# Patient Record
Sex: Female | Born: 1975 | Race: Black or African American | Hispanic: No | Marital: Single | Smoking: Current some day smoker
Health system: Southern US, Community
[De-identification: ages and names within clinical notes are randomized; demographics above are authoritative.]

## PROBLEM LIST (undated history)

## (undated) DIAGNOSIS — E119 Type 2 diabetes mellitus without complications: Secondary | ICD-10-CM

## (undated) DIAGNOSIS — M109 Gout, unspecified: Secondary | ICD-10-CM

## (undated) DIAGNOSIS — D696 Thrombocytopenia, unspecified: Secondary | ICD-10-CM

## (undated) DIAGNOSIS — E538 Deficiency of other specified B group vitamins: Secondary | ICD-10-CM

## (undated) DIAGNOSIS — M1711 Unilateral primary osteoarthritis, right knee: Secondary | ICD-10-CM

## (undated) DIAGNOSIS — I10 Essential (primary) hypertension: Secondary | ICD-10-CM

## (undated) DIAGNOSIS — M1712 Unilateral primary osteoarthritis, left knee: Secondary | ICD-10-CM

## (undated) HISTORY — PX: TONSILLECTOMY: SUR1361

## (undated) NOTE — *Deleted (*Deleted)
[facility]  [address] [place], [place] [address] Phone: [phone]  Fax: [phone]  Clinic Day:  10/30/2020  Referring physician: Armando Gang, FNP  Chief Complaint: Taylor Rodgers is a 51 y.o. female with thrombocytopenia who is seen for reassessment.  HPI: The patient was last seen in the hematology clinic on 09/29/2019 via telemedicine. At that time, she was doing well.  She denied any excess bruising or bleeding. CBC on 09/22/2019 revealed a hematocrit of 37.0, hemoglobin 12.3, MCV 92.5, platelets 76,000, WBC 9600 with an ANC of 5600.  She was to follow up in 1 month for labs and review of abdominal ultrasound to assess spleen size.  She was lost to follow-up.  During the interim, ***   Past Medical History:  Diagnosis Date  . Diabetes mellitus without complication (HCC)   . Gout   . Hypertension     Past Surgical History:  Procedure Laterality Date  . TONSILLECTOMY      Family History  Problem Relation Age of Onset  . Hypertension Mother   . Cancer Mother   . Cancer Father     Social History:  reports that she has been smoking cigarettes. She has never used smokeless tobacco. She reports current alcohol use. She reports that she does not use drugs. She has a deceased son. 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 She works in [employer]. She is exposed to hepatitis, HIV, and AIDS because she work with a lot of homeless individuals. She makes sure to get checked regularly. She lives in [place], [place]. The patient is alone*** today.  Allergies:  Allergies  Allergen Reactions  . Gluten Meal   . Other     Glue    Current Medications: Current Outpatient Medications  Medication Sig Dispense Refill  . allopurinol (ZYLOPRIM) 300 MG tablet Take 300 mg by mouth daily.  2  . atenolol (TENORMIN) 100 MG tablet Take 100 mg by mouth 2 (two) times daily.  2  . chlorthalidone (HYGROTON) 50 MG  tablet Take 50 mg by mouth daily.     . Cholecalciferol (VITAMIN D3) 25 MCG (1000 UT) CAPS Take 1 capsule by mouth daily.     . cyanocobalamin (,VITAMIN B-12,) 1000 MCG/ML injection INJECT 1 ML ONCE EVERY MONTH    . docusate sodium (COLACE) 100 MG capsule Take 100 mg by mouth daily.    Marland Kitchen EDARBYCLOR 40-25 MG TABS Take 1 tablet by mouth daily.    . FEROSUL 325 (65 Fe) MG tablet Take 325 mg by mouth daily.    Marland Kitchen levocetirizine (XYZAL) 5 MG tablet Take 5 mg by mouth daily.  5  . metFORMIN (GLUCOPHAGE) 500 MG tablet Take 500 mg by mouth 2 (two) times daily.  2  . potassium chloride SA (KLOR-CON) 20 MEQ tablet Take 20 mEq by mouth daily.     . pravastatin (PRAVACHOL) 20 MG tablet Take 20 mg by mouth every evening.  0   No current facility-administered medications for this visit.    Review of Systems  Constitutional: Negative for chills, diaphoresis, fever, malaise/fatigue and weight loss.       Feels "good".  HENT: Negative.  Negative for congestion, ear pain, hearing loss, nosebleeds, sinus pain and sore throat.   Eyes: Negative.  Negative for blurred vision and double vision.  Respiratory: Negative.  Negative for cough, hemoptysis, sputum production and shortness of breath.   Cardiovascular: Negative.  Negative for chest  pain, palpitations and leg swelling.  Gastrointestinal: Negative for abdominal pain, blood in stool, constipation, diarrhea, heartburn, melena, nausea and vomiting.  Genitourinary: Negative.  Negative for dysuria, frequency, hematuria and urgency.       Menses around the 14th - 18th of the month.  Musculoskeletal: Positive for joint pain (bad knees). Negative for back pain, myalgias and neck pain.  Skin: Negative for itching and rash.       Eczema.  Neurological: Negative.  Negative for dizziness, tingling, sensory change, speech change, focal weakness, weakness and headaches.  Endo/Heme/Allergies: Does not bruise/bleed easily.       Type II diabetes, well managed.   Psychiatric/Behavioral: Negative.  Negative for depression and memory loss. The patient is not nervous/anxious and does not have insomnia.   All other systems reviewed and are negative.  Performance status (ECOG): 0***  Physical Exam Nursing note reviewed.  Constitutional:      General: She is not in acute distress.    Appearance: She is well-developed.  HENT:     Head: Normocephalic and atraumatic.  Eyes:     General: No scleral icterus.    Conjunctiva/sclera: Conjunctivae normal.     Comments: Brown eyes.  Neurological:     Mental Status: She is alert and oriented to person, place, and time.  Psychiatric:        Behavior: Behavior normal.        Thought Content: Thought content normal.        Judgment: Judgment normal.     No visits with results within 3 Day(s) from this visit.  Latest known visit with results is:  Appointment on 09/22/2019  Component Date Value Ref Range Status  . Path Review 09/22/2019 Blood smear is reviewed.   Final   Comment: Thrombocytopenia, with unremarkable platelet morphology. Normal RBC indices. No increase in schistocytes. Normal WBC count and differential. The cause of the patient's thrombocytopenia is unclear from morphologic evaluation. Clinical correlation is recommended. Reviewed by Beryle Quant, M.D. Performed at [facility], [address] [place], [place] [address]   . aPTT 09/22/2019 32  24 - 36 seconds Final   Performed at [facility], [address] [place], [place] [address]  . Prothrombin Time 09/22/2019 13.0  11.4 - 15.2 seconds Final  . INR 09/22/2019 1.0  0.8 - 1.2 Final   Comment: (NOTE) INR goal varies based on device and disease states. Performed at [facility], [address], [place], [place] [address]   . Anti Nuclear Antibody (ANA) 09/22/2019 Negative  Negative Final   Comment: (NOTE) Performed At: [facility] [address] [place], [place] [id number] Jolene Schimke  MD IO:[id number]   . HIV Screen 4th Generation wRfx 09/22/2019 NON REACTIVE  NON REACTIVE Final   Performed at [facility], [address], [place], [place] [address]  . HCV Ab 09/22/2019 NON REACTIVE  NON REACTIVE Final   Comment: (NOTE) Nonreactive HCV antibody screen is consistent with no HCV infections,  unless recent infection is suspected or other evidence exists to indicate HCV infection. Performed at [facility], [address] [place], [place] [address]   . Hepatitis B Surface Ag 09/22/2019 NON REACTIVE  NON REACTIVE Final   Performed at [facility], [address] [place], [place] [address]  . Hep B Core Total Ab 09/22/2019 NON REACTIVE  NON REACTIVE Final   Performed at [facility], [address], [place], [place] [address]  .  Platelet CT in Citrate 09/22/2019 62   Final   Performed at [facility], [address], [place], [place] [address]  . WBC 09/22/2019 9.6  4.0 - 10.5 K/uL Final  . RBC 09/22/2019 4.00  3.87 - 5.11 MIL/uL Final  . Hemoglobin 09/22/2019 12.3  12.0 - 15.0 g/dL Final  . HCT 46/96/2952 37.0  36 - 46 % Final  . MCV 09/22/2019 92.5  80.0 - 100.0 fL Final  . MCH 09/22/2019 30.8  26.0 - 34.0 pg Final  . MCHC 09/22/2019 33.2  30.0 - 36.0 g/dL Final  . RDW 84/13/2440 13.1  11.5 - 15.5 % Final  . Platelets 09/22/2019 76* 150 - 400 K/uL Final   Comment: Immature Platelet Fraction may be clinically indicated, consider ordering this additional test [id number]   . nRBC 09/22/2019 0.0  0.0 - 0.2 % Final  . Neutrophils Relative % 09/22/2019 57  % Final  . Neutro Abs 09/22/2019 5.6  1.7 - 7.7 K/uL Final  . Lymphocytes Relative 09/22/2019 31  % Final  . Lymphs Abs 09/22/2019 3.0  0.7 - 4.0 K/uL Final  . Monocytes Relative 09/22/2019 9  % Final  . Monocytes Absolute 09/22/2019 0.8  0.1 - 1.0 K/uL Final  . Eosinophils Relative 09/22/2019 2  % Final  . Eosinophils Absolute 09/22/2019 0.1  0.0 - 0.5 K/uL Final  . Basophils  Relative 09/22/2019 1  % Final  . Basophils Absolute 09/22/2019 0.1  0.0 - 0.1 K/uL Final  . Immature Granulocytes 09/22/2019 0  % Final  . Abs Immature Granulocytes 09/22/2019 0.03  0.00 - 0.07 K/uL Final   Performed at [facility], [address] [place], [place] [address]    Assessment:  Taylor Rodgers is a 42 y.o. female with thrombocytopenia.  Platelet count was 76,000 on 08/25/2019. MPV was 13.8 (6-12).  Patient denies any new medications or herbal products.  Labs on 08/25/2019 included hematocrit 37.0, hemoglobin 12.0, MCV 96.0, platelets 76,000, and WBC 8,600 with an ANC of 4900.  Differential included 57% segs, 32% lymphs, 8% monocytes and 2% eosinophils.  Normal studies included:  Creatinine (0.81), LFTs, vitamin B12 (1019), folate (> 24.00).  Ferritin was 26.1 with an iron saturation of 28% and a TIBC of 423.  Uric acid was 7.1 (2.3-6.6). TSH was 2.829 with a T3 of 81 (87-178) and a T4 9.66 (6.1-12.2).   Work-up on 09/22/2019 revealed a hematocrit 37, hemoglobin 12.3, MCV 92.5, platelets 76,000, WBC 9600, ANC 5600. Platelet count in citrate tube was 62,000. Normal studies included:  ANA , PT, PTT, hepatitis B surface antigen, hepatitis B core antibody, hepatitis C antibody, and HIV testing.  Peripheral smear revealed thrombocytopenia with unremarkable platelet morphology.   She has vitamin B deficiency.  She is on oral B12.  She is on oral iron.  The patient received the Pfizer COVID-19 vaccine on 02/17/2020 and 03/13/2020.  Symptomatically, ***  Plan: 1.   Labs today: CBC with diff, IPF, CMP, ferritin, iron studies, B12, folate.   2.   Thrombocytopenia             Work-up is negative to date.  No new medications or herbal products implicated.  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   Discuss diagnosis of excluuion.   Discuss obtaining an abdominal ultrasound to assess spleen size (should be normal).             Patient  notes low platelets have been associated  with menses x    Check CBC on 10/12/2019. 3.   Hypertension             Blood pressure better controlled. 4.   RTC on 10/12/2019 for labs (CBC with diff). 5.   Abdominal ultrasound- assess spleen size. 6.   RTC in 1 month for MD assessment, labs (CBC with diff), and review of abdominal ultrasound.  I discussed the assessment and treatment plan with the patient.  The patient was provided an opportunity to ask questions and all were answered.  The patient agreed with the plan and demonstrated an understanding of the instructions.  The patient was advised to call back if the symptoms worsen or if the condition fails to improve as anticipated.  I provided *** minutes of face-to-face time during this this encounter and > 50% was spent counseling as documented under my assessment and plan.  Rosey Bath, MD, PhD    10/30/2020, 1:58 PM  I, Jerilee Field, am acting as a Neurosurgeon for Rosey Bath, MD.  I, Dishon Kehoe C. Merlene Pulling, MD, have reviewed the above documentation for accuracy and completeness, and I agree with the above.

---

## 2010-04-20 ENCOUNTER — Emergency Department: Payer: Self-pay | Admitting: Emergency Medicine

## 2010-09-07 ENCOUNTER — Emergency Department: Payer: Self-pay | Admitting: Emergency Medicine

## 2011-08-27 ENCOUNTER — Ambulatory Visit: Payer: Self-pay | Admitting: Family Medicine

## 2013-02-14 ENCOUNTER — Ambulatory Visit: Payer: Self-pay

## 2013-12-23 ENCOUNTER — Emergency Department: Payer: Self-pay | Admitting: Emergency Medicine

## 2018-08-19 ENCOUNTER — Ambulatory Visit
Admission: EM | Admit: 2018-08-19 | Discharge: 2018-08-19 | Disposition: A | Discharge status: Home / Self Care | Payer: 59 | Attending: Family Medicine | Admitting: Family Medicine

## 2018-08-19 ENCOUNTER — Encounter: Payer: Self-pay | Admitting: Emergency Medicine

## 2018-08-19 ENCOUNTER — Other Ambulatory Visit: Payer: Self-pay

## 2018-08-19 DIAGNOSIS — R350 Frequency of micturition: Secondary | ICD-10-CM

## 2018-08-19 DIAGNOSIS — R300 Dysuria: Secondary | ICD-10-CM

## 2018-08-19 HISTORY — DX: Type 2 diabetes mellitus without complications: E11.9

## 2018-08-19 HISTORY — DX: Essential (primary) hypertension: I10

## 2018-08-19 HISTORY — DX: Gout, unspecified: M10.9

## 2018-08-19 LAB — URINALYSIS, COMPLETE (UACMP) WITH MICROSCOPIC
BACTERIA UA: NONE SEEN
Bilirubin Urine: NEGATIVE
GLUCOSE, UA: NEGATIVE mg/dL
Hgb urine dipstick: NEGATIVE
Ketones, ur: NEGATIVE mg/dL
LEUKOCYTES UA: NEGATIVE
NITRITE: POSITIVE — AB
PH: 7 (ref 5.0–8.0)
Protein, ur: >300 mg/dL — AB
SPECIFIC GRAVITY, URINE: 1.025 (ref 1.005–1.030)

## 2018-08-19 MED ORDER — FLUCONAZOLE 150 MG PO TABS: 150.0000 mg | ORAL_TABLET | Freq: Every day | ORAL | 0 refills | Status: DC

## 2018-08-19 MED ORDER — CEPHALEXIN 500 MG PO CAPS: 500.0000 mg | ORAL_CAPSULE | Freq: Two times a day (BID) | ORAL | 0 refills | Status: DC

## 2018-08-19 NOTE — ED Triage Notes (Signed)
Patient c/o burning when urinating and increase in urinary frequency that started on Wed.

## 2018-08-19 NOTE — ED Provider Notes (Signed)
[facility]    CSN: [id number] Arrival date & time: 08/19/18  1746     History   Chief Complaint Chief Complaint  Patient presents with  . Urinary Frequency  . Dysuria    HPI Taylor Rodgers is a 42 y.o. female.   The history is provided by the patient.  Dysuria  Pain quality:  Burning Pain severity:  Moderate Onset quality:  Sudden Duration:  3 days Timing:  Constant Progression:  Unchanged Chronicity:  New Recent urinary tract infections: no   Relieved by:  Nothing Ineffective treatments:  Phenazopyridine and cranberry juice Urinary symptoms: frequent urination   Associated symptoms: vaginal discharge (reports had a recent yeast infection treated with over the counter antifungal cream and improved but not completely resolved)   Risk factors: no hx of pyelonephritis, no hx of urolithiasis, no kidney transplant, not pregnant, no recurrent urinary tract infections, no renal cysts, no renal disease, no single kidney and no urinary catheter     Past Medical History:  Diagnosis Date  . Diabetes mellitus without complication (HCC)   . Gout   . Hypertension     There are no active problems to display for this patient.   Past Surgical History:  Procedure Laterality Date  . TONSILLECTOMY      OB History   None      Home Medications    Prior to Admission medications   Medication Sig Start Date End Date Taking? Authorizing Provider  atenolol (TENORMIN) 100 MG tablet Take 100 mg by mouth 2 (two) times daily. 08/11/18  Yes [provider]  carvedilol (COREG) 25 MG tablet Take 25 mg by mouth 2 (two) times daily. 08/11/18  Yes [provider]  enalapril (VASOTEC) 20 MG tablet Take 20 mg by mouth 2 (two) times daily. 08/11/18  Yes [provider]  hydrochlorothiazide (HYDRODIURIL) 50 MG tablet Take 50 mg by mouth daily. 07/23/18  Yes [provider]  levocetirizine (XYZAL) 5 MG tablet Take 5 mg by mouth daily. 08/08/18   Yes [provider]  metFORMIN (GLUCOPHAGE) 500 MG tablet Take 500 mg by mouth 2 (two) times daily. 08/06/18  Yes [provider]  potassium chloride (K-DUR) 10 MEQ tablet Take 10 mEq by mouth daily. 08/12/18  Yes [provider]  pravastatin (PRAVACHOL) 20 MG tablet Take 20 mg by mouth every evening. 08/11/18  Yes [provider]  allopurinol (ZYLOPRIM) 300 MG tablet Take 300 mg by mouth daily. 08/11/18   [provider]  cephALEXin (KEFLEX) 500 MG capsule Take 1 capsule (500 mg total) by mouth 2 (two) times daily. 08/19/18   Payton Mccallum, MD  fluconazole (DIFLUCAN) 150 MG tablet Take 1 tablet (150 mg total) by mouth daily. 08/19/18   Payton Mccallum, MD    Family History Family History  Problem Relation Age of Onset  . Hypertension Mother   . Cancer Mother   . Cancer Father     Social History Social History   Tobacco Use  . Smoking status: Current Some Day Smoker    Types: Cigarettes  . Smokeless tobacco: Never Used  Substance Use Topics  . Alcohol use: Yes  . Drug use: Never     Allergies   Patient has no known allergies.   Review of Systems Review of Systems  Genitourinary: Positive for dysuria and vaginal discharge (reports had a recent yeast infection treated with over the counter antifungal cream and improved but not completely resolved).     Physical  Exam Triage Vital Signs ED Triage Vitals  Enc Vitals Group     BP 08/19/18 1821 (!) 170/94     Pulse Rate 08/19/18 1821 71     Resp 08/19/18 1821 16     Temp 08/19/18 1821 98.4 F (36.9 C)     Temp Source 08/19/18 1821 Oral     SpO2 08/19/18 1821 100 %     Weight 08/19/18 1819 (!) 330 lb (149.7 kg)     Height 08/19/18 1819 5\' 8"  (1.727 m)     Head Circumference --      Peak Flow --      Pain Score 08/19/18 1819 5     Pain Loc --      Pain Edu? --      Excl. in GC? --    No data found.  Updated Vital Signs BP (!) 170/94 (BP Location: Right Arm)   Pulse 71    Temp 98.4 F (36.9 C) (Oral)   Resp 16   Ht 5\' 8"  (1.727 m)   Wt (!) 149.7 kg   LMP 08/03/2018 (Approximate)   SpO2 100%   BMI 50.18 kg/m   Visual Acuity Right Eye Distance:   Left Eye Distance:   Bilateral Distance:    Right Eye Near:   Left Eye Near:    Bilateral Near:     Physical Exam  Constitutional: She appears well-developed and well-nourished. No distress.  Abdominal: Soft. Bowel sounds are normal. She exhibits no distension and no mass. There is no tenderness. There is no rebound and no guarding.  Skin: She is not diaphoretic.  Nursing note and vitals reviewed.    UC Treatments / Results  Labs (all labs ordered are listed, but only abnormal results are displayed) Labs Reviewed  URINALYSIS, COMPLETE (UACMP) WITH MICROSCOPIC - Abnormal; Notable for the following components:      Result Value   Protein, ur >300 (*)    Nitrite POSITIVE (*)    All other components within normal limits  URINE CULTURE    EKG None  Radiology No results found.  Procedures Procedures (including critical care time)  Medications Ordered in UC Medications - No data to display  Initial Impression / Assessment and Plan / UC Course  I have reviewed the triage vital signs and the nursing notes.  Pertinent labs & imaging results that were available during my care of the patient were reviewed by me and considered in my medical decision making (see chart for details).      Final Clinical Impressions(s) / UC Diagnoses   Final diagnoses:  Dysuria  Urinary frequency   Discharge Instructions   None    ED Prescriptions    Medication Sig Dispense Auth. Provider   cephALEXin (KEFLEX) 500 MG capsule Take 1 capsule (500 mg total) by mouth 2 (two) times daily. 6 capsule Payton Mccallumonty, Keryn Nessler, MD   fluconazole (DIFLUCAN) 150 MG tablet Take 1 tablet (150 mg total) by mouth daily. 1 tablet Payton Mccallumonty, Analy Bassford, MD     1. diagnosis reviewed with patient 2. rx as per orders above; reviewed  possible side effects, interactions, risks and benefits  3. Recommend supportive treatment with increased fluids 4. Follow-up prn if symptoms worsen or don't improve   Controlled Substance Prescriptions [place] Controlled Substance Registry consulted? Not Applicable   Payton Mccallumonty, Rashanda Magloire, MD 08/19/18 1919

## 2018-08-21 LAB — URINE CULTURE: SPECIAL REQUESTS: NORMAL

## 2019-08-31 NOTE — Progress Notes (Signed)
[facility]  [address] [place], [place] [address] Phone: [phone]  Fax: [phone]   Clinic Day:  09/05/2019  Referring physician: Armando Gang, FNP  Chief Complaint: Taylor Rodgers is a 43 y.o. female with thrombocytopenia who is referred in consultation by Franco Nones, FNP  for assessment and management.   HPI:  The patient was seen by Franco Nones, FNP on 08/25/2019. She noted vertigo and feeling "crappy". She had a rash on her left arm.  Platelet count was 76,000. MPV was 13.8 (6-12).  She continued her current medication regimen. She was advised to continue monitoring her glucose levels at home. She was encouraged to follow her diabetic diet and exercise regularly.   Additional labs on 08/25/2019 included hematocrit 37.0, hemoglobin 12.0, MCV 96.0, WBC 8,600 with an ANC of 4900.  Differential included 57% segs, 32% lymphs, 8% monocytes and 2% eosinophils.  Creatinine was 0.81.  LFTs were normal.  Vitamin B12 was 1019. Folate was > 24.00. Ferritin was 26.1 with an iron saturation of 28% and a TIBC of 423.  Uric acid was 7.1 (2.3-6.6). TSH was 2.829 with a T3 of 81 (87-178) and a T4 9.66 (6.1-12.2).   She has a history of COPD, type II diabetes mellitus, and vitamin B deficiency since 03/01/2017. She is taking vitamin B12 1,000 mcg a day.   Symptomatically, the patient feels "nervous".  Her BP is 210/97 in the clinic today. Repeat BP was 234/120.  Decision was made to postpone her clinic visit and send her directly to the [facility] to manage her blood pressure.    Past Medical History:  Diagnosis Date  . Diabetes mellitus without complication (HCC)   . Gout   . Hypertension     Past Surgical History:  Procedure Laterality Date  . TONSILLECTOMY      Family History  Problem Relation Age of Onset  . Hypertension Mother   . Cancer Mother   . Cancer Father     Social History:  reports that she has been smoking  cigarettes. She has never used smokeless tobacco. She reports current alcohol use. She reports that she does not use drugs.The patient is alone today.  Allergies: No Known Allergies  Current Medications: Current Outpatient Medications  Medication Sig Dispense Refill  . allopurinol (ZYLOPRIM) 300 MG tablet Take 300 mg by mouth daily.  2  . atenolol (TENORMIN) 100 MG tablet Take 100 mg by mouth 2 (two) times daily.  2  . Cholecalciferol (VITAMIN D3) 25 MCG (1000 UT) CAPS Take by mouth.    . cyanocobalamin (,VITAMIN B-12,) 1000 MCG/ML injection INJECT 1 ML ONCE EVERY MONTH    . EDARBYCLOR 40-25 MG TABS Take 1 tablet by mouth daily.    . FEROSUL 325 (65 Fe) MG tablet Take 325 mg by mouth daily.    . fluconazole (DIFLUCAN) 150 MG tablet Take 1 tablet (150 mg total) by mouth daily. 1 tablet 0  . levocetirizine (XYZAL) 5 MG tablet Take 5 mg by mouth daily.  5  . metFORMIN (GLUCOPHAGE) 500 MG tablet Take 500 mg by mouth 2 (two) times daily.  2  . potassium chloride SA (KLOR-CON) 20 MEQ tablet     . pravastatin (PRAVACHOL) 20 MG tablet Take 20 mg by mouth every evening.  0  . carvedilol (COREG) 25 MG tablet Take 25 mg by mouth 2 (two) times daily.  2  . cephALEXin (KEFLEX) 500 MG capsule Take 1 capsule (500  mg total) by mouth 2 (two) times daily. (Patient not taking: Reported on 09/05/2019) 6 capsule 0  . enalapril (VASOTEC) 20 MG tablet Take 20 mg by mouth 2 (two) times daily.  2  . hydrochlorothiazide (HYDRODIURIL) 50 MG tablet Take 50 mg by mouth daily.  2  . potassium chloride (K-DUR) 10 MEQ tablet Take 10 mEq by mouth daily.  2   No current facility-administered medications for this visit.     Review of Systems  Constitutional: Negative for chills, diaphoresis, fever, malaise/fatigue and weight loss.       Feels "nervous".  HENT: Negative for congestion, ear discharge, ear pain, hearing loss, nosebleeds, sinus pain and sore throat.   Eyes: Negative for blurred vision and double vision.   Respiratory: Negative for cough, hemoptysis, sputum production and shortness of breath.        COPD.  Cardiovascular: Negative for chest pain, palpitations and leg swelling.  Gastrointestinal: Negative for abdominal pain, blood in stool, constipation, diarrhea, heartburn, melena, nausea and vomiting.  Genitourinary: Negative for dysuria, frequency, hematuria and urgency.  Musculoskeletal: Negative for back pain, joint pain, myalgias and neck pain.  Skin: Negative for itching and rash.  Neurological: Negative for dizziness, tingling, sensory change, weakness and headaches.  Endo/Heme/Allergies: Does not bruise/bleed easily.       Type II diabetes.  Psychiatric/Behavioral: Negative for depression and memory loss. The patient is nervous/anxious. The patient does not have insomnia.   All other systems reviewed and are negative.  Performance status (ECOG): not evaluated  Vitals Blood pressure (!) 210/97, pulse 69, resp. rate 18, height 5\' 8"  (1.727 m), weight (!) 365 lb 15.4 oz (166 kg), SpO2 100 %.   No visits with results within 3 Day(s) from this visit.  Latest known visit with results is:  Admission on 08/19/2018, Discharged on 08/19/2018  Component Date Value Ref Range Status  . Color, Urine 08/19/2018 YELLOW  YELLOW Final  . APPearance 08/19/2018 CLEAR  CLEAR Final  . Specific Gravity, Urine 08/19/2018 1.025  1.005 - 1.030 Final  . pH 08/19/2018 7.0  5.0 - 8.0 Final  . Glucose, UA 08/19/2018 NEGATIVE  NEGATIVE mg/dL Final  . Hgb urine dipstick 08/19/2018 NEGATIVE  NEGATIVE Final  . Bilirubin Urine 08/19/2018 NEGATIVE  NEGATIVE Final  . Ketones, ur 08/19/2018 NEGATIVE  NEGATIVE mg/dL Final  . Protein, ur 08/19/2018 >300* NEGATIVE mg/dL Final  . Nitrite 08/19/2018 POSITIVE* NEGATIVE Final  . Leukocytes, UA 08/19/2018 NEGATIVE  NEGATIVE Final  . Squamous Epithelial / LPF 08/19/2018 0-5  0 - 5 Final  . WBC, UA 08/19/2018 0-5  0 - 5 WBC/hpf Final  . RBC / HPF 08/19/2018 0-5  0 - 5  RBC/hpf Final  . Bacteria, UA 08/19/2018 NONE SEEN  NONE SEEN Final   Performed at [facility], [address], [place], [place] [address]  . Specimen Description 08/19/2018    Final                   Value:URINE, CLEAN CATCH Performed at [facility], [address] [place], [place] [address]   . Special Requests 08/19/2018    Final                   Value:Normal Performed at [facility], [address] [place], [place] [address]   . Culture 08/19/2018 MULTIPLE SPECIES PRESENT, SUGGEST RECOLLECTION*  Final  . Report Status 08/19/2018 08/21/2018 FINAL   Final  Assessment:  Taylor Rodgers is a 43 y.o. female with thrombocytopenia.  Platelet count was 76,000 on 08/25/2019. MPV was 13.8 (6-12).  Labs on 08/25/2019 included hematocrit 37.0, hemoglobin 12.0, MCV 96.0, platelets 76,000, and WBC 8,600 with an ANC of 4900.  Differential included 57% segs, 32% lymphs, 8% monocytes and 2% eosinophils.  Normal studies included:  Creatinine (0.81), LFTs, vitamin B12 (1019), folate (> 24.00).  Ferritin was 26.1 with an iron saturation of 28% and a TIBC of 423.  Uric acid was 7.1 (2.3-6.6). TSH was 2.829 with a T3 of 81 (87-178) and a T4 9.66 (6.1-12.2).   She has vitamin B deficiency.  She is on oral B12.  Blood pressure is BP is 210/97 (repeat 234/120).  Plan: 1.   Planned labs:  CBC with diff, platelet count in a blue top tube, PT, PTT, ANA with reflex, hepatitis B core antibody total, hepatitis B surface antigen, hepatitis C antibody, HIV antibody. 2.   Peripheral smear for pathology review. 3.   Thrombocytopenia  Consultation rescheduled for 09/22/2019 per patient request. 4.   Hypertension  Patient extremely nervous about being in the cancer center today.  Discuss that we also evaluate blood disorders such as anemia.  Reassured her that we are evaluating a mildly low platelet count.  She is [facility] for management of  her blood pressure. 5.  Reschedule consultation.   I discussed the assessment and treatment plan with the patient.  The patient was provided an opportunity to ask questions and all were answered.  The patient agreed with the plan and demonstrated an understanding of the instructions.  The patient was advised to call back if the symptoms worsen or if the condition fails to improve as anticipated.  I provided 6 minutes of face-to-face time during this this encounter and > 50% was spent counseling as documented under my assessment and plan.    Melissa C. Merlene Pullingorcoran, MD, PhD    09/05/2019, 11:59 AM  I, Theador HawthorneAlexis Patterson, am acting as scribe for Melissa C. Merlene Pullingorcoran, MD, PhD.  I, Melissa C. Merlene Pullingorcoran, MD, have reviewed the above documentation for accuracy and completeness, and I agree with the above.   This encounter was created in error - please disregard. This encounter was created in error - please disregard.

## 2019-09-04 ENCOUNTER — Other Ambulatory Visit: Payer: Self-pay

## 2019-09-05 ENCOUNTER — Other Ambulatory Visit: Payer: Self-pay

## 2019-09-05 ENCOUNTER — Encounter: Payer: Self-pay | Admitting: Emergency Medicine

## 2019-09-05 ENCOUNTER — Encounter: Payer: Self-pay | Admitting: Hematology and Oncology

## 2019-09-05 ENCOUNTER — Inpatient Hospital Stay: Payer: 59 | Attending: Hematology and Oncology | Admitting: Hematology and Oncology

## 2019-09-05 ENCOUNTER — Ambulatory Visit
Admission: EM | Admit: 2019-09-05 | Discharge: 2019-09-05 | Disposition: A | Discharge status: Home / Self Care | Payer: 59

## 2019-09-05 VITALS — BP 234/120 | HR 73 | Resp 18 | Ht 68.0 in | Wt 366.0 lb

## 2019-09-05 DIAGNOSIS — F430 Acute stress reaction: Secondary | ICD-10-CM | POA: Diagnosis not present

## 2019-09-05 DIAGNOSIS — I10 Essential (primary) hypertension: Secondary | ICD-10-CM | POA: Diagnosis not present

## 2019-09-05 DIAGNOSIS — Z862 Personal history of diseases of the blood and blood-forming organs and certain disorders involving the immune mechanism: Secondary | ICD-10-CM

## 2019-09-05 DIAGNOSIS — E119 Type 2 diabetes mellitus without complications: Secondary | ICD-10-CM | POA: Insufficient documentation

## 2019-09-05 DIAGNOSIS — D696 Thrombocytopenia, unspecified: Secondary | ICD-10-CM | POA: Insufficient documentation

## 2019-09-05 DIAGNOSIS — J449 Chronic obstructive pulmonary disease, unspecified: Secondary | ICD-10-CM | POA: Insufficient documentation

## 2019-09-05 DIAGNOSIS — E539 Vitamin B deficiency, unspecified: Secondary | ICD-10-CM | POA: Insufficient documentation

## 2019-09-05 NOTE — ED Triage Notes (Addendum)
Patient sent over from cancer center for high blood pressure prior to her visit. She states she has elevated BP when she goes to the doctors office. She is being treated for HTN and takes medications. She takes her medication around 2-3pm and took her medicine yesterday at 2pm. She also takes her medicine around 2am and also took it at this time.

## 2019-09-05 NOTE — Discharge Instructions (Signed)
You blood pressure is still high today. Continue to monitor. Take your blood pressure medication as planned today. Call your PCP this afternoon to schedule appointment for follow-up for elevated blood pressure. If any chest pain, difficulty breathing, vision changes, dizziness, or nausea occurs, go to the ER ASAP. Otherwise, follow-up with your PCP as planned.

## 2019-09-05 NOTE — ED Provider Notes (Signed)
[facility]    CSN: [id number] Arrival date & time: 09/05/19  1217      History   Chief Complaint Chief Complaint  Patient presents with  . Hypertension    HPI Taylor Rodgers is a 43 y.o. female.   43 year old female presents with elevated blood pressure readings today. She was at an appointment at the cancer center for further evaluation of thrombocytopenia but they were concerned over her persistent high blood pressure readings and sent her over to Urgent Care to be evaluated. She was very nervous and anxious going to the doctor's office and was confused that the Hematologist who is evaluating her for low platelets is at the [facility] and she thought they were hiding the fact that she might have cancer She has had HTN for many years and currently takes Atenolol and Edarbyclor (ACE combined with thiazide diuretic) for management. She works 2nd shift so she usually takes her medication at 2pm before going to work and then again at 2am before going to bed. She is due to take her medication within the next hour. She has had elevated blood pressure readings before but never this high. She is asymptomatic. She denies any headache, change in vision, dizziness, chest pain, palpitations, difficulty breathing, cough, abdominal pain, nausea, or peripheral edema. Does smoke cigarettes daily. Other chronic health issues include Diabetes, hyperlipidemia, gout, anemia, and environmental allergies. Additional medication include Glucophage, Pravachol, Allopurinol, K supplements, Xyzal, Vit B12, Vit D, Fe and Fish oil daily.   The history is provided by the patient.    Past Medical History:  Diagnosis Date  . Diabetes mellitus without complication (HCC)   . Gout   . Hypertension     Patient Active Problem List   Diagnosis Date Noted  . Thrombocytopenia (HCC) 09/05/2019    Past Surgical History:  Procedure Laterality Date  . TONSILLECTOMY      OB History   No obstetric  history on file.      Home Medications    Prior to Admission medications   Medication Sig Start Date End Date Taking? Authorizing Provider  allopurinol (ZYLOPRIM) 300 MG tablet Take 300 mg by mouth daily. 08/11/18  Yes [provider]  atenolol (TENORMIN) 100 MG tablet Take 100 mg by mouth 2 (two) times daily. 08/11/18  Yes [provider]  Cholecalciferol (VITAMIN D3) 25 MCG (1000 UT) CAPS Take by mouth.   Yes [provider]  cyanocobalamin (,VITAMIN B-12,) 1000 MCG/ML injection INJECT 1 ML ONCE EVERY MONTH 06/20/19  Yes [provider]  EDARBYCLOR 40-25 MG TABS Take 1 tablet by mouth daily. 08/23/19  Yes [provider]  FEROSUL 325 (65 Fe) MG tablet Take 325 mg by mouth daily. 08/01/19  Yes [provider]  levocetirizine (XYZAL) 5 MG tablet Take 5 mg by mouth daily. 08/08/18  Yes [provider]  metFORMIN (GLUCOPHAGE) 500 MG tablet Take 500 mg by mouth 2 (two) times daily. 08/06/18  Yes [provider]  potassium chloride SA (KLOR-CON) 20 MEQ tablet  08/28/19  Yes [provider]  pravastatin (PRAVACHOL) 20 MG tablet Take 20 mg by mouth every evening. 08/11/18  Yes [provider]  carvedilol (COREG) 25 MG tablet Take 25 mg by mouth 2 (two) times daily. 08/11/18 09/05/19 Yes [provider]  enalapril (VASOTEC) 20 MG tablet Take 20 mg by mouth 2 (two) times daily. 08/11/18 09/05/19  [provider]  hydrochlorothiazide (HYDRODIURIL) 50 MG tablet Take 50  mg by mouth daily. 07/23/18 09/05/19  [provider]  potassium chloride (K-DUR) 10 MEQ tablet Take 10 mEq by mouth daily. 08/12/18 09/05/19  [provider]    Family History Family History  Problem Relation Age of Onset  . Hypertension Mother   . Cancer Mother   . Cancer Father     Social History Social History   Tobacco Use  . Smoking status: Current Some Day Smoker    Types: Cigarettes  . Smokeless tobacco: Never  Used  Substance Use Topics  . Alcohol use: Yes  . Drug use: Never     Allergies   Patient has no known allergies.   Review of Systems Review of Systems  Constitutional: Negative for activity change, appetite change, chills, diaphoresis, fatigue and fever.  HENT: Negative for facial swelling, nosebleeds, sinus pressure and sinus pain.   Eyes: Negative for photophobia and visual disturbance.  Respiratory: Negative for cough, chest tightness, shortness of breath and wheezing.   Cardiovascular: Negative for chest pain, palpitations and leg swelling.  Gastrointestinal: Negative for abdominal pain, nausea and vomiting.  Genitourinary: Negative for decreased urine volume and difficulty urinating.  Neurological: Negative for dizziness, tremors, seizures, syncope, facial asymmetry, speech difficulty, weakness, light-headedness, numbness and headaches.  Hematological: Negative for adenopathy. Bruises/bleeds easily.  Psychiatric/Behavioral: The patient is nervous/anxious.      Physical Exam Triage Vital Signs ED Triage Vitals  Enc Vitals Group     BP 09/05/19 1230 (!) 200/120     Pulse Rate 09/05/19 1230 71     Resp 09/05/19 1230 18     Temp 09/05/19 1230 97.9 F (36.6 C)     Temp Source 09/05/19 1230 Oral     SpO2 09/05/19 1230 98 %     Weight 09/05/19 1233 (!) 360 lb (163.3 kg)     Height 09/05/19 1233 5\' 8"  (1.727 m)     Head Circumference --      Peak Flow --      Pain Score 09/05/19 1233 0     Pain Loc --      Pain Edu? --      Excl. in GC? --    No data found.  Updated Vital Signs BP (!) 196/110   Pulse 71   Temp 97.9 F (36.6 C) (Oral)   Resp 18   Ht 5\' 8"  (1.727 m)   Wt (!) 360 lb (163.3 kg)   SpO2 98%   BMI 54.74 kg/m   Visual Acuity Right Eye Distance:   Left Eye Distance:   Bilateral Distance:    Right Eye Near:   Left Eye Near:    Bilateral Near:     Physical Exam Vitals signs and nursing note reviewed.  Constitutional:      General: She is  awake. She is not in acute distress.    Appearance: She is well-developed and well-groomed. She is obese. She is not ill-appearing.     Comments: She is sitting comfortably in the exam chair in no acute distress.   HENT:     Head: Normocephalic and atraumatic.     Right Ear: Hearing and external ear normal.     Left Ear: Hearing and external ear normal.  Eyes:     Extraocular Movements: Extraocular movements intact.     Conjunctiva/sclera: Conjunctivae normal.  Neck:     Musculoskeletal: Normal range of motion and neck supple. No neck rigidity or muscular tenderness.     Vascular: No carotid bruit.  Cardiovascular:     Rate and Rhythm: Normal rate and regular rhythm.     Pulses: Normal pulses.     Heart sounds: Normal heart sounds. No murmur.  Pulmonary:     Effort: Pulmonary effort is normal. No respiratory distress.     Breath sounds: Normal breath sounds and air entry. No stridor or decreased air movement. No decreased breath sounds, wheezing, rhonchi or rales.  Musculoskeletal: Normal range of motion.  Lymphadenopathy:     Cervical: No cervical adenopathy.  Skin:    General: Skin is warm and dry.     Capillary Refill: Capillary refill takes less than 2 seconds.     Findings: No rash.  Neurological:     General: No focal deficit present.     Mental Status: She is alert and oriented to person, place, and time.  Psychiatric:        Attention and Perception: Attention normal.        Mood and Affect: Mood is anxious.        Speech: Speech normal.        Behavior: Behavior normal. Behavior is cooperative.        Thought Content: Thought content normal.        Judgment: Judgment normal.      UC Treatments / Results  Labs (all labs ordered are listed, but only abnormal results are displayed) Labs Reviewed - No data to display  EKG   Radiology No results found.  Procedures Procedures (including critical care time)  Medications Ordered in UC Medications - No data to  display  Initial Impression / Assessment and Plan / UC Course  I have reviewed the triage vital signs and the nursing notes.  Pertinent labs & imaging results that were available during my care of the patient were reviewed by me and considered in my medical decision making (see chart for details).    Rechecked blood pressure- still very elevated but slowly decreasing. Patient is stable and asymptomatic with long history of HTN. Did not perform any additional work up at this time. Did not give her any medication to lower blood pressure at this time since she is planning on going home and taking her medication. Explained in detail about thrombocytopenia and the various causes which seemed to calm her. Encouraged to continue to monitor her BP. Call her PCP this afternoon to schedule appointment for follow-up for HTN. If any chest pain, difficulty breathing, dizziness, vision changes, headache or nausea occur, go to the ER ASAP. Otherwise, follow-up with her PCP as planned.  Final Clinical Impressions(s) / UC Diagnoses   Final diagnoses:  Elevated blood pressure reading with diagnosis of hypertension  History of thrombocytopenia  Acute reaction to situational stress     Discharge Instructions     You blood pressure is still high today. Continue to monitor. Take your blood pressure medication as planned today. Call your PCP this afternoon to schedule appointment for follow-up for elevated blood pressure. If any chest pain, difficulty breathing, vision changes, dizziness, or nausea occurs, go to the ER ASAP. Otherwise, follow-up with your PCP as planned.     ED Prescriptions    None     PDMP not reviewed this encounter.   Katy Apo, NP 09/05/19 2256

## 2019-09-07 NOTE — Progress Notes (Deleted)
[facility]  [address], [address] [place], [place] [address] Phone: [phone]  Fax: [phone]   Clinic Day:  09/07/2019  Referring physician: No ref. provider Rodgers  Chief Complaint: Taylor RiegerLatonya R Rodgers is a 43 y.o. female with thrombocytopenia who is referred in consultation by Taylor Nonesheryl Lindley, FNP  for assessment and management.   HPI: The patient was seen by Taylor Nonesheryl Lindley, FNP on 08/25/2019. She had vertigo and felt "crappy". The patient had a rash on her left arm.  Platelet count was 76,000. MPV was 13.8 (6-12).  She continued her current medication regimen. She was advised to continue monitoring her glucose levels at home. She was encouraged to follow her diabetic diet and exercise regularly.   Additional labs on 08/25/2019 included hematocrit 37.0, hemoglobin 12.0, MCV 96.0, WBC 8,600 with an ANC of 4900.  Differential included 57% segs, 32% lymphs, 8% monocytes and 2% eosinophils.  Creatinine was 0.81.  LFTs were normal.  Vitamin B12 was 1019. Folate was > 24.00. Ferritin was 26.1 with an iron saturation of 28% and a TIBC of 423.  Uric acid was 7.1 (2.3-6.6). TSH was 2.829 with a T3 of 81 (87-178) and a T4 9.66 (6.1-12.2).   She has a history of COPD, type II diabetes mellitus, and vitamin B deficiency since 03/01/2017. She is taking vitamin B12 1,000 mcg a day.   Symptomatically, the patient feels "nervous". Her BP is 210/97 in the clinic today. Her repeat BP is 234/120. [facility] so they could monitor her BP.    Past Medical History:  Diagnosis Date  . Diabetes mellitus without complication (HCC)   . Gout   . Hypertension     Past Surgical History:  Procedure Laterality Date  . TONSILLECTOMY      Family History  Problem Relation Age of Onset  . Hypertension Mother   . Cancer Mother   . Cancer Father     Social History:  reports that she has been smoking cigarettes. She has never used smokeless tobacco. She  reports current alcohol use. She reports that she does not use drugs.The patient is alone today.  Allergies: Taylor Known Allergies  Current Medications: Current Outpatient Medications  Medication Sig Dispense Refill  . allopurinol (ZYLOPRIM) 300 MG tablet Take 300 mg by mouth daily.  2  . atenolol (TENORMIN) 100 MG tablet Take 100 mg by mouth 2 (two) times daily.  2  . Cholecalciferol (VITAMIN D3) 25 MCG (1000 UT) CAPS Take by mouth.    . cyanocobalamin (,VITAMIN B-12,) 1000 MCG/ML injection INJECT 1 ML ONCE EVERY MONTH    . EDARBYCLOR 40-25 MG TABS Take 1 tablet by mouth daily.    . FEROSUL 325 (65 Fe) MG tablet Take 325 mg by mouth daily.    Marland Kitchen. levocetirizine (XYZAL) 5 MG tablet Take 5 mg by mouth daily.  5  . metFORMIN (GLUCOPHAGE) 500 MG tablet Take 500 mg by mouth 2 (two) times daily.  2  . potassium chloride SA (KLOR-CON) 20 MEQ tablet     . pravastatin (PRAVACHOL) 20 MG tablet Take 20 mg by mouth every evening.  0   Taylor current facility-administered medications for this visit.     Review of Systems  Constitutional: Negative for chills, diaphoresis, fever, malaise/fatigue and weight loss.       Feels "nervous".  HENT: Negative for congestion, ear discharge, ear pain, hearing loss, nosebleeds, sinus pain and sore throat.   Eyes: Negative for  blurred vision and double vision.  Respiratory: Negative for cough, hemoptysis, sputum production and shortness of breath.        COPD.  Cardiovascular: Negative for chest pain, palpitations and leg swelling.  Gastrointestinal: Negative for abdominal pain, blood in stool, constipation, diarrhea, heartburn, melena, nausea and vomiting.  Genitourinary: Negative for dysuria, frequency, hematuria and urgency.  Musculoskeletal: Negative for back pain, joint pain, myalgias and neck pain.  Skin: Negative for itching and rash.  Neurological: Negative for dizziness, tingling, sensory change, weakness and headaches.  Endo/Heme/Allergies: Does not  bruise/bleed easily.       Type II diabetes.  Psychiatric/Behavioral: Negative for depression and memory loss. The patient is nervous/anxious. The patient does not have insomnia.   All other systems reviewed and are negative.   Performance status (ECOG): ***  Vitals There were Taylor vitals taken for this visit.    Physical Exam  Constitutional: She is oriented to person, place, and time. She appears well-developed and well-nourished. Taylor distress.  HENT:  Head: Normocephalic and atraumatic.  Mouth/Throat: Oropharynx is clear and moist. Taylor oropharyngeal exudate.  Eyes: Pupils are equal, round, and reactive to light. Conjunctivae and EOM are normal. Taylor scleral icterus.  Neck: Normal range of motion. Neck supple. Taylor JVD present.  Cardiovascular: Normal rate, regular rhythm and normal heart sounds.  Taylor murmur heard. Pulmonary/Chest: Effort normal and breath sounds normal. Taylor respiratory distress. She has Taylor wheezes. She has Taylor rales. She exhibits Taylor tenderness.  Abdominal: Soft. Bowel sounds are normal. She exhibits Taylor distension. There is Taylor abdominal tenderness.  Musculoskeletal: Normal range of motion.        General: Taylor tenderness or edema.  Lymphadenopathy:    She has Taylor cervical adenopathy.    She has Taylor axillary adenopathy.       Right: Taylor supraclavicular adenopathy present.       Left: Taylor supraclavicular adenopathy present.  Neurological: She is alert and oriented to person, place, and time.  Skin: Skin is warm and dry. She is not diaphoretic.  Psychiatric: She has a normal mood and affect. Her behavior is normal. Judgment and thought content normal.  Nursing note and vitals reviewed.   Taylor visits with results within 3 Day(s) from this visit.  Latest known visit with results is:  Admission on 08/19/2018, Discharged on 08/19/2018  Component Date Value Ref Range Status  . Color, Urine 08/19/2018 YELLOW  YELLOW Final  . APPearance 08/19/2018 CLEAR  CLEAR Final  . Specific  Gravity, Urine 08/19/2018 1.025  1.005 - 1.030 Final  . pH 08/19/2018 7.0  5.0 - 8.0 Final  . Glucose, UA 08/19/2018 NEGATIVE  NEGATIVE mg/dL Final  . Hgb urine dipstick 08/19/2018 NEGATIVE  NEGATIVE Final  . Bilirubin Urine 08/19/2018 NEGATIVE  NEGATIVE Final  . Ketones, ur 08/19/2018 NEGATIVE  NEGATIVE mg/dL Final  . Protein, ur 08/19/2018 >300* NEGATIVE mg/dL Final  . Nitrite 08/19/2018 POSITIVE* NEGATIVE Final  . Leukocytes, UA 08/19/2018 NEGATIVE  NEGATIVE Final  . Squamous Epithelial / LPF 08/19/2018 0-5  0 - 5 Final  . WBC, UA 08/19/2018 0-5  0 - 5 WBC/hpf Final  . RBC / HPF 08/19/2018 0-5  0 - 5 RBC/hpf Final  . Bacteria, UA 08/19/2018 NONE SEEN  NONE SEEN Final   Performed at [facility], [address] [place], [place] [address]  . Specimen Description 08/19/2018    Final  Value:URINE, CLEAN CATCH Performed at [facility], [address] [place], [place] [address]   . Special Requests 08/19/2018    Final                   Value:Normal Performed at [facility], [address], [place], [place] [address]   . Culture 08/19/2018 MULTIPLE SPECIES PRESENT, SUGGEST RECOLLECTION*  Final  . Report Status 08/19/2018 08/21/2018 FINAL   Final    Assessment:  Taylor Rodgers is a 43 y.o. female with thrombocytopenia.  Platelet count was 76,000 on 08/25/2019. MPV was 13.8 (6-12).  Meds Herbal  Labs on 08/25/2019 included hematocrit 37.0, hemoglobin 12.0, MCV 96.0, platelets 76,000, and WBC 8,600 with an ANC of 4900.  Differential included 57% segs, 32% lymphs, 8% monocytes and 2% eosinophils.  Normal studies included:  Creatinine (0.81), LFTs, vitamin B12 (1019), folate (> 24.00).  Ferritin was 26.1 with an iron saturation of 28% and a TIBC of 423.  Uric acid was 7.1 (2.3-6.6). TSH was 2.829 with a T3 of 81 (87-178) and a T4 9.66 (6.1-12.2).   She has vitamin B deficiency.  She is on oral B12.  Symptomatically, ***  Plan:  1.   Labs today:  CBC with diff, platelet count in a blue top tube, PT, PTT, ANA with reflex, hepatitis B core antibody total, hepatitis B surface antigen, hepatitis C antibody, HIV antibody (patient consented). 2.   Peripheral smear for pathology review. 3.   Thrombocytopenia    I discussed the assessment and treatment plan with the patient.  The patient was provided an opportunity to ask questions and all were answered.  The patient agreed with the plan and demonstrated an understanding of the instructions.  The patient was advised to call back if the symptoms worsen or if the condition fails to improve as anticipated.  I provided 6 minutes of face-to-face time during this this encounter and > 50% was spent counseling as documented under my assessment and plan.    Melissa C. Merlene Pulling, MD, PhD    09/07/2019, 6:55 AM  I, Theador Hawthorne, am acting as scribe for Melissa C. Merlene Pulling, MD, PhD.  {Add scribe attestation statement}

## 2019-09-08 ENCOUNTER — Encounter: Payer: 59 | Admitting: Hematology and Oncology

## 2019-09-12 ENCOUNTER — Telehealth: Payer: Self-pay | Admitting: Hematology and Oncology

## 2019-09-20 NOTE — Progress Notes (Signed)
[facility]  [address], [address] [place], [place] [address] Phone: [phone]  Fax: [phone]   Clinic Day:  09/22/2019  Referring physician: Remi Haggard, FNP  Chief Complaint: Taylor Rodgers is a 43 y.o. female with thrombocytopenia who is referred in consultation by Threasa Alpha, FNP  for assessment and management.   HPI:  The patient was seen by Threasa Alpha, FNP on 08/25/2019. She noted vertigo and feeling "crappy".  She states that she was in the midst of her menstrual cycle.  She had a rash on her left arm (eczema) which has subsequently improved.  Platelet count was 76,000. MPV was 13.8 (6-12).  She continued her current medication regimen. She was advised to continue monitoring her glucose levels at home. She was encouraged to follow her diabetic diet and exercise regularly.   Additional labs on 08/25/2019 included hematocrit 37.0, hemoglobin 12.0, MCV 96.0, WBC 8,600 with an [place] of 4900.  Differential included 57% segs, 32% lymphs, 8% monocytes and 2% eosinophils.  Creatinine was 0.81.  LFTs were normal.  Vitamin B12 was 1019. Folate was > 24.00. Ferritin was 26.1 with an iron saturation of 28% and a TIBC of 423.  Uric acid was 7.1 (2.3-6.6). TSH was 2.829 with a T3 of 81 (87-178) and a T4 9.66 (6.1-12.2).   The patient was scheduled to be seen in the hematology clinic for an initial consult on 09/05/2019. At that time, she felt "nervous".  BP was 210/97. Repeat BP was 234/120.  Decisions were made to postpone her clinic visit and send her directly to the [facility] to manage her blood pressure.   She states "nothing was done" in urgent care.  Symptomatically, she feels "ok".   She is currently on her period  (started on 09/20/2019).  She notes a heavy flow on the second day; she uses a period cup; changing her cup every 2 hours. For the rest of her cycle, she changes the cup every 3-4 hours.  Current menses is not excessive.  She  has lower abdominal cramps secondary to menses.  She has low energy which increases when she is on her menstrual cycle.   She denies any other bleeding. She has no unexplained bruises.  She had a tonsillectomy at age 36 and childbirth with no excessive bleeding.  There is no family history of any blood disorder.  She was diagnosed with vitamin B12 deficiency on 03/01/2017.  She takes oral B-12 1,000 mcg.  She has been on oral iron x 4-5 months.  She takes fish oil, vitamin D3, coconut oil, and turmeric. She drinks alkaline water. She does not drink quinine water. She has been on oral iron x 4 months. She has type II diabetes; diabetes is well managed.  She has "bad" knees.  Her BP is 200/110 and her pulse rate is 75 in the clinic today.  I strongly urged the patient to go to urgent care or the ER; the patient denied. She denies any medication change except for edarbyclor.    Past Medical History:  Diagnosis Date  . Diabetes mellitus without complication ([place])   . Gout   . Hypertension     Past Surgical History:  Procedure Laterality Date  . TONSILLECTOMY      Family History  Problem Relation Age of Onset  . Hypertension Mother   . Cancer Mother   . Cancer Father   No family history of blood disorders. Her mother has esophageal cancer.  Social History:  reports that she has been smoking cigarettes. She has never used smokeless tobacco. She reports current alcohol use. She reports that she does not use drugs.  She drinks alcohol socially. She smokes 0.5 pack a day since age 66.  She has a deceased son. 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 She works in [employer]. She is exposed to hepatitis, HIV, and AIDS because she work with a lot of homeless individuals. She makes sure to get checked regularly. She lives in [place], [place]. The patient is alone today.  Allergies: No Known Allergies  Current Medications: Current Outpatient Medications  Medication Sig  Dispense Refill  . allopurinol (ZYLOPRIM) 300 MG tablet Take 300 mg by mouth daily.  2  . atenolol (TENORMIN) 100 MG tablet Take 100 mg by mouth 2 (two) times daily.  2  . Cholecalciferol (VITAMIN D3) 25 MCG (1000 UT) CAPS Take by mouth.    . cyanocobalamin (,VITAMIN B-12,) 1000 MCG/ML injection INJECT 1 ML ONCE EVERY MONTH    . EDARBYCLOR 40-25 MG TABS Take 1 tablet by mouth daily.    . FEROSUL 325 (65 Fe) MG tablet Take 325 mg by mouth daily.    Marland Kitchen levocetirizine (XYZAL) 5 MG tablet Take 5 mg by mouth daily.  5  . metFORMIN (GLUCOPHAGE) 500 MG tablet Take 500 mg by mouth 2 (two) times daily.  2  . pravastatin (PRAVACHOL) 20 MG tablet Take 20 mg by mouth every evening.  0  . potassium chloride SA (KLOR-CON) 20 MEQ tablet      No current facility-administered medications for this visit.     Review of Systems  Constitutional: Negative for chills, diaphoresis, fever, malaise/fatigue and weight loss.       Feels "ok".  HENT: Negative.  Negative for congestion, ear discharge, ear pain, hearing loss, nosebleeds, sinus pain and sore throat.   Eyes: Negative.  Negative for blurred vision and double vision.  Respiratory: Negative.  Negative for cough, hemoptysis, sputum production and shortness of breath.   Cardiovascular: Negative.  Negative for chest pain, palpitations and leg swelling.  Gastrointestinal: Positive for abdominal pain (lower abdominal cramps secondary to menses). Negative for blood in stool, constipation, diarrhea, heartburn, melena, nausea and vomiting.  Genitourinary: Negative.  Negative for dysuria, frequency, hematuria and urgency.  Musculoskeletal: Positive for joint pain (bad knees). Negative for back pain, myalgias and neck pain.  Skin: Negative for itching and rash.       Eczema.  Neurological: Negative.  Negative for dizziness, tingling, sensory change, speech change, focal weakness, weakness and headaches.  Endo/Heme/Allergies: Does not bruise/bleed easily.       Type  II diabetes, well managed.  Psychiatric/Behavioral: Negative.  Negative for depression and memory loss. The patient is not nervous/anxious and does not have insomnia.   All other systems reviewed and are negative.  Performance status (ECOG): 0  Vitals Blood pressure (!) 200/110, pulse 75, temperature 98.9 F (37.2 C), temperature source Tympanic, resp. rate 18, height 5\' 8"  (1.727 m), weight (!) 363 lb 5.1 oz (164.8 kg), SpO2 99 %.   Physical Exam  Constitutional: She is oriented to person, place, and time. She appears well-developed and well-nourished.  Patient sitting in a chair with her legs up on another chair resting comfortably in no acute distress.  HENT:  Head: Normocephalic and atraumatic.  Mouth/Throat: Oropharynx is clear and moist. No oropharyngeal exudate.  Long brown braids.  Mask.  Eyes: Pupils are equal,  round, and reactive to light. Conjunctivae and EOM are normal. No scleral icterus.  Brown eyes.  Neck: Normal range of motion. Neck supple. No JVD present.  Cardiovascular: Normal rate, regular rhythm and normal heart sounds.  No murmur heard. Pulmonary/Chest: Effort normal and breath sounds normal. No respiratory distress. She has no wheezes. She has no rales. She exhibits no tenderness.  Abdominal: Soft. Bowel sounds are normal. She exhibits no distension and no mass. There is no abdominal tenderness. There is no rebound and no guarding.  No appreciable hepatosplenomegaly.  Musculoskeletal: Normal range of motion.        General: Edema (slight right ankle) present. No tenderness.  Lymphadenopathy:       Head (right side): No preauricular, no posterior auricular and no occipital adenopathy present.       Head (left side): No preauricular, no posterior auricular and no occipital adenopathy present.    She has no cervical adenopathy.    She has no axillary adenopathy.       Right: No inguinal and no supraclavicular adenopathy present.       Left: No inguinal and no  supraclavicular adenopathy present.  Neurological: She is alert and oriented to person, place, and time.  Skin: Skin is warm and dry. No rash noted. She is not diaphoretic. No erythema. No pallor.  No petechiae.  Psychiatric: She has a normal mood and affect. Her behavior is normal. Judgment and thought content normal.  Nursing note and vitals reviewed.   Lifestyle  Physical activity  . Days per week: Not on file  . Minutes per session: Not on file     No visits with results within 3 Day(s) from this visit.  Latest known visit with results is:  Admission on 08/19/2018, Discharged on 08/19/2018  Component Date Value Ref Range Status  . Color, Urine 08/19/2018 YELLOW  YELLOW Final  . APPearance 08/19/2018 CLEAR  CLEAR Final  . Specific Gravity, Urine 08/19/2018 1.025  1.005 - 1.030 Final  . pH 08/19/2018 7.0  5.0 - 8.0 Final  . Glucose, UA 08/19/2018 NEGATIVE  NEGATIVE mg/dL Final  . Hgb urine dipstick 08/19/2018 NEGATIVE  NEGATIVE Final  . Bilirubin Urine 08/19/2018 NEGATIVE  NEGATIVE Final  . Ketones, ur 08/19/2018 NEGATIVE  NEGATIVE mg/dL Final  . Protein, ur 40/98/119109/20/2019 >300* NEGATIVE mg/dL Final  . Nitrite 47/82/956209/20/2019 POSITIVE* NEGATIVE Final  . Leukocytes, UA 08/19/2018 NEGATIVE  NEGATIVE Final  . Squamous Epithelial / LPF 08/19/2018 0-5  0 - 5 Final  . WBC, UA 08/19/2018 0-5  0 - 5 WBC/hpf Final  . RBC / HPF 08/19/2018 0-5  0 - 5 RBC/hpf Final  . Bacteria, UA 08/19/2018 NONE SEEN  NONE SEEN Final   Performed at [facility], [address] [place], [place] [address]  . Specimen Description 08/19/2018    Final                   Value:URINE, CLEAN CATCH Performed at [facility], [address] [place], [place] [address]   . Special Requests 08/19/2018    Final                   Value:Normal Performed at [facility], [address] [place], [place] [address]   . Culture 08/19/2018 MULTIPLE SPECIES PRESENT, SUGGEST RECOLLECTION*   Final  . Report Status 08/19/2018 08/21/2018 FINAL   Final    Assessment:  Alice RiegerLatonya R Swire is a 43 y.o.  female with thrombocytopenia.  Platelet count was 76,000 on 08/25/2019. MPV was 13.8 (6-12).  Patient denies any new medications or herbal products.  Labs on 08/25/2019 included hematocrit 37.0, hemoglobin 12.0, MCV 96.0, platelets 76,000, and WBC 8,600 with an ANC of 4900.  Differential included 57% segs, 32% lymphs, 8% monocytes and 2% eosinophils.  Normal studies included:  Creatinine (0.81), LFTs, vitamin B12 (1019), folate (> 24.00).  Ferritin was 26.1 with an iron saturation of 28% and a TIBC of 423.  Uric acid was 7.1 (2.3-6.6). TSH was 2.829 with a T3 of 81 (87-178) and a T4 9.66 (6.1-12.2).   She has vitamin B deficiency.  She is on oral B12.  She is on oral iron.  She has poorly controlled hypertension.  Symptoamtically, she feels good.  She denies any excess bruising or bleeding.  She is currently on her menses.  Exam reveals no appreciable hepatosplenomgaly.  Plan: 1.   Labs today:  CBC with diff, platelet count in a blue top tube, PT, PTT, ANA with reflex, hepatitis B core antibody total, hepatitis B surface antigen, hepatitis C antibody, HIV antibody (patient consented). 2.   Peripheral smear for pathology review. 3.   Thrombocytopenia  Etiology unclear.  Patient notes low platelets associated with menses x 2.  Patient denies any new medications or herbal products except for edarbyclor.   No known association with thrombocytopenia.  Discuss work-up.  4.   Hypertension  Patient was initially extremely nervous about being in the cancer center on 12/05/2018.  Patient reassured then and again today.  Encouraged her to follow-up with Urgent Care or the ER.  Patient declined.  Discussed risk of uncontrolled hypertension.  Patient stated she would follow-up with Franco Nones, FNP on 09/25/2019. 5.   RTC in 1 week (in person or D[facility]) for review of work-up and discussion  regarding direction of therapy.  I discussed the assessment and treatment plan with the patient.  The patient was provided an opportunity to ask questions and all were answered.  The patient agreed with the plan and demonstrated an understanding of the instructions.  The patient was advised to call back if the symptoms worsen or if the condition fails to improve as anticipated.  I provided 29 minutes of face-to-face time during this this encounter and > 50% was spent counseling as documented under my assessment and plan.    Melissa C. Merlene Pulling, MD, PhD    09/22/2019, 1:55 PM  I, Theador Hawthorne, am acting as scribe for [employer]. Merlene Pulling, MD, PhD.  I, Melissa C. Merlene Pulling, MD, have reviewed the above documentation for accuracy and completeness, and I agree with the above.

## 2019-09-22 ENCOUNTER — Telehealth: Payer: Self-pay

## 2019-09-22 ENCOUNTER — Inpatient Hospital Stay (HOSPITAL_BASED_OUTPATIENT_CLINIC_OR_DEPARTMENT_OTHER): Payer: 59 | Admitting: Hematology and Oncology

## 2019-09-22 ENCOUNTER — Other Ambulatory Visit: Payer: Self-pay

## 2019-09-22 ENCOUNTER — Inpatient Hospital Stay: Payer: 59

## 2019-09-22 ENCOUNTER — Encounter: Payer: Self-pay | Admitting: Hematology and Oncology

## 2019-09-22 VITALS — BP 213/111 | HR 75 | Temp 98.9°F | Resp 18 | Ht 68.0 in | Wt 363.3 lb

## 2019-09-22 DIAGNOSIS — E538 Deficiency of other specified B group vitamins: Secondary | ICD-10-CM

## 2019-09-22 DIAGNOSIS — I10 Essential (primary) hypertension: Secondary | ICD-10-CM | POA: Diagnosis not present

## 2019-09-22 DIAGNOSIS — D696 Thrombocytopenia, unspecified: Secondary | ICD-10-CM

## 2019-09-22 DIAGNOSIS — E119 Type 2 diabetes mellitus without complications: Secondary | ICD-10-CM | POA: Diagnosis not present

## 2019-09-22 DIAGNOSIS — E539 Vitamin B deficiency, unspecified: Secondary | ICD-10-CM | POA: Diagnosis not present

## 2019-09-22 DIAGNOSIS — J449 Chronic obstructive pulmonary disease, unspecified: Secondary | ICD-10-CM | POA: Diagnosis not present

## 2019-09-22 LAB — CBC WITH DIFFERENTIAL/PLATELET
Abs Immature Granulocytes: 0.03 10*3/uL (ref 0.00–0.07)
Basophils Absolute: 0.1 10*3/uL (ref 0.0–0.1)
Basophils Relative: 1 %
Eosinophils Absolute: 0.1 10*3/uL (ref 0.0–0.5)
Eosinophils Relative: 2 %
HCT: 37 % (ref 36.0–46.0)
Hemoglobin: 12.3 g/dL (ref 12.0–15.0)
Immature Granulocytes: 0 %
Lymphocytes Relative: 31 %
Lymphs Abs: 3 10*3/uL (ref 0.7–4.0)
MCH: 30.8 pg (ref 26.0–34.0)
MCHC: 33.2 g/dL (ref 30.0–36.0)
MCV: 92.5 fL (ref 80.0–100.0)
Monocytes Absolute: 0.8 10*3/uL (ref 0.1–1.0)
Monocytes Relative: 9 %
Neutro Abs: 5.6 10*3/uL (ref 1.7–7.7)
Neutrophils Relative %: 57 %
Platelets: 76 10*3/uL — ABNORMAL LOW (ref 150–400)
RBC: 4 MIL/uL (ref 3.87–5.11)
RDW: 13.1 % (ref 11.5–15.5)
WBC: 9.6 10*3/uL (ref 4.0–10.5)
nRBC: 0 % (ref 0.0–0.2)

## 2019-09-22 LAB — HEPATITIS B SURFACE ANTIGEN: Hepatitis B Surface Ag: NONREACTIVE

## 2019-09-22 LAB — PLATELET BY CITRATE: Platelet CT in Citrate: 62

## 2019-09-22 LAB — PROTIME-INR
INR: 1 (ref 0.8–1.2)
Prothrombin Time: 13 seconds (ref 11.4–15.2)

## 2019-09-22 LAB — APTT: aPTT: 32 seconds (ref 24–36)

## 2019-09-22 NOTE — Telephone Encounter (Signed)
Called the patient PCP office to inform them of the patient b/p in office today. The office closed at 12:00 today and want be back in office until monday. [phone] ( NP Lavena Bullion) 1st b/p 216/113 2 nd repeat 200/110 3 rd repeat 202/120. The patient states she took b/p medication at 1:00 pm today. 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 Dr Mike Gip has been informed.

## 2019-09-22 NOTE — Telephone Encounter (Signed)
due to the patient b/p Per Dr Mike Gip has instructed the patient to go to the ED or Urgent care the patient refused both and states she is going to work with out be evaluated by a MD for b/p

## 2019-09-23 LAB — ANA W/REFLEX: Anti Nuclear Antibody (ANA): NEGATIVE

## 2019-09-23 LAB — HEPATITIS C ANTIBODY: HCV Ab: NONREACTIVE

## 2019-09-23 LAB — HEPATITIS B CORE ANTIBODY, TOTAL: Hep B Core Total Ab: NONREACTIVE

## 2019-09-23 LAB — HIV ANTIBODY (ROUTINE TESTING W REFLEX): HIV Screen 4th Generation wRfx: NONREACTIVE

## 2019-09-25 LAB — PATHOLOGIST SMEAR REVIEW

## 2019-09-27 NOTE — Progress Notes (Signed)
Confirmed Name, DOB, and Address. Denies any concerns.  

## 2019-09-28 NOTE — Progress Notes (Signed)
[facility]  [address], [address] [place], [place] [address] Phone: [phone]  Fax: [phone]  Telemedicine Office Visit:  09/29/2019  Referring physician: Armando Gang, FNP  I connected with Taylor Rodgers on 09/29/19 at 2:07 PM by videoconferencing and verified that I was speaking with the correct person using 2 identifiers.  The patient was at home.  I discussed the limitations, risk, security and privacy concerns of performing an evaluation and management service by videoconferencing and the availability of in person appointments.  I also discussed with the patient that there may be a patient responsible charge related to this service.  The patient expressed understanding and agreed to proceed.   Chief Complaint: Taylor Rodgers is a 43 y.o. female with thrombocytopenia who is seen for review of work-up and discussion regarding direction of therapy   HPI: The patient was last seen in the hematology clinic on 09/22/2019 for initial consultation. She described having a low platelet count on 2 separate occasions when she had a menses.  She had poorly controlled HTN.  Exam revealed no adenopathy or appreciable hepatosplenomegaly.  Work-up on 09/22/2019 revealed a hematocrit 37, hemoglobin 12.3, MCV 92.5, platelets 76,000, WBC 9600, ANC 5600. Platelet count in citrate tube was 62,000. ANA was negative.  PT was 13.0 (INR 1.0).  PTT was 32.  Hepatitis B surface antigen, hepatitis B core antibody, hepatitis C antibody, and HIV testing were negative.  Peripheral smear revealed thrombocytopenia with unremarkable platelet morphology.   During the interim, her blood pressure medication has been changed.  She recently started taking chlorthalidone. She feels much better, her uneasy feeling is gone.  She states that her normal meses begins around 14th-18th of the month.  Symptomatically, she is doing very well.  She notices improvement in her blood pressure    Past Medical History:  Diagnosis Date  . Diabetes mellitus without complication (HCC)   . Gout   . Hypertension     Past Surgical History:  Procedure Laterality Date  . TONSILLECTOMY      Family History  Problem Relation Age of Onset  . Hypertension Mother   . Cancer Mother   . Cancer Father     Social History:  reports that she has been smoking cigarettes. She has never used smokeless tobacco. She reports current alcohol use. She reports that she does not use drugs. She has a deceased son. 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 She works in [employer]. She is exposed to hepatitis, HIV, and AIDS because she work with a lot of homeless individuals. She makes sure to get checked regularly. She lives in [place], [place]. The patient is alone today.  Participants in the patient's visit and their role in the encounter included the patient, Arnette Norris, scribe, and [employer], RN today.  The intake visit was provided by Delano Metz, RN.  Allergies:  Allergies  Allergen Reactions  . Gluten Meal   . Other     Glue    Current Medications: Current Outpatient Medications  Medication Sig Dispense Refill  . allopurinol (ZYLOPRIM) 300 MG tablet Take 300 mg by mouth daily.  2  . atenolol (TENORMIN) 100 MG tablet Take 100 mg by mouth 2 (two) times daily.  2  . Cholecalciferol (VITAMIN D3) 25 MCG (1000 UT) CAPS Take 1 capsule by mouth daily.     . cyanocobalamin (,VITAMIN B-12,) 1000 MCG/ML injection INJECT 1 ML ONCE EVERY MONTH    .  docusate sodium (COLACE) 100 MG capsule Take 100 mg by mouth daily.    Marland Kitchen. EDARBYCLOR 40-25 MG TABS Take 1 tablet by mouth daily.    . FEROSUL 325 (65 Fe) MG tablet Take 325 mg by mouth daily.    Marland Kitchen. levocetirizine (XYZAL) 5 MG tablet Take 5 mg by mouth daily.  5  . metFORMIN (GLUCOPHAGE) 500 MG tablet Take 500 mg by mouth 2 (two) times daily.  2  . potassium chloride SA (KLOR-CON) 20 MEQ tablet Take 20 mEq by mouth  daily.     . pravastatin (PRAVACHOL) 20 MG tablet Take 20 mg by mouth every evening.  0   No current facility-administered medications for this visit.     Review of Systems  Constitutional: Negative for chills, diaphoresis, fever, malaise/fatigue and weight loss.       Feels "good".  HENT: Negative.  Negative for congestion, ear pain, hearing loss, nosebleeds, sinus pain and sore throat.   Eyes: Negative.  Negative for blurred vision and double vision.  Respiratory: Negative.  Negative for cough, hemoptysis, sputum production and shortness of breath.   Cardiovascular: Negative.  Negative for chest pain, palpitations and leg swelling.  Gastrointestinal: Negative for abdominal pain, blood in stool, constipation, diarrhea, heartburn, melena, nausea and vomiting.  Genitourinary: Negative.  Negative for dysuria, frequency, hematuria and urgency.       Menses around the 14th - 18th of the month.  Musculoskeletal: Positive for joint pain (bad knees). Negative for back pain, myalgias and neck pain.  Skin: Negative for itching and rash.       Eczema.  Neurological: Negative.  Negative for dizziness, tingling, sensory change, speech change, focal weakness, weakness and headaches.  Endo/Heme/Allergies: Does not bruise/bleed easily.       Type II diabetes, well managed.  Psychiatric/Behavioral: Negative.  Negative for depression and memory loss. The patient is not nervous/anxious and does not have insomnia.   All other systems reviewed and are negative.  Performance status (ECOG): 0  Physical Exam  Constitutional: She is oriented to person, place, and time. She appears well-developed and well-nourished. No distress.  HENT:  Head: Normocephalic and atraumatic.  Long brown braids.  Eyes: Conjunctivae and EOM are normal. No scleral icterus.  Brown eyes.  Neurological: She is alert and oriented to person, place, and time.  Psychiatric: She has a normal mood and affect. Her behavior is normal.  Judgment and thought content normal.  Nursing note reviewed.   No visits with results within 3 Day(s) from this visit.  Latest known visit with results is:  Appointment on 09/22/2019  Component Date Value Ref Range Status  . Path Review 09/22/2019 Blood smear is reviewed.   Final   Comment: Thrombocytopenia, with unremarkable platelet morphology. Normal RBC indices. No increase in schistocytes. Normal WBC count and differential. The cause of the patient's thrombocytopenia is unclear from morphologic evaluation. Clinical correlation is recommended. Reviewed by Beryle QuantHeath M. Jones, M.D. Performed at [facility], [address] [place], [place] [address]   . aPTT 09/22/2019 32  24 - 36 seconds Final   Performed at [facility], [address] [place], [place] [address]  . Prothrombin Time 09/22/2019 13.0  11.4 - 15.2 seconds Final  . INR 09/22/2019 1.0  0.8 - 1.2 Final   Comment: (NOTE) INR goal varies based on device and disease states. Performed at [facility], [address] [place], [place] [address]   . Anti Nuclear Antibody (ANA) 09/22/2019 Negative  Negative Final   Comment: (NOTE) Performed At: [facility] [place], [place] [id number] Rush Farmer MD CW:[id number]   . HIV Screen 4th Generation wRfx 09/22/2019 NON REACTIVE  NON REACTIVE Final   Performed at [facility], [place] [address] [place], [place] [address]  . HCV Ab 09/22/2019 NON REACTIVE  NON REACTIVE Final   Comment: (NOTE) Nonreactive HCV antibody screen is consistent with no HCV infections,  unless recent infection is suspected or other evidence exists to indicate HCV infection. Performed at [facility], [place] [address] [place], [place] [address]   . Hepatitis B Surface Ag 09/22/2019 NON REACTIVE  NON REACTIVE Final   Performed at [facility], [place] [address] [place], [place] [address]  . Hep B Core Total Ab 09/22/2019 NON REACTIVE   NON REACTIVE Final   Performed at [facility], [place] [address], [place], [place] [address]  . Platelet CT in [place] 09/22/2019 62   Final   Performed at [facility], [address] [place], [place] [address]  . WBC 09/22/2019 9.6  4.0 - 10.5 K/uL Final  . RBC 09/22/2019 4.00  3.87 - 5.11 MIL/uL Final  . Hemoglobin 09/22/2019 12.3  12.0 - 15.0 g/dL Final  . HCT 09/22/2019 37.0  36.0 - 46.0 % Final  . MCV 09/22/2019 92.5  80.0 - 100.0 fL Final  . MCH 09/22/2019 30.8  26.0 - 34.0 pg Final  . MCHC 09/22/2019 33.2  30.0 - 36.0 g/dL Final  . RDW 09/22/2019 13.1  11.5 - 15.5 % Final  . Platelets 09/22/2019 76* 150 - 400 K/uL Final   Comment: Immature Platelet Fraction may be clinically indicated, consider ordering this additional test [id number]   . nRBC 09/22/2019 0.0  0.0 - 0.2 % Final  . Neutrophils Relative % 09/22/2019 57  % Final  . Neutro Abs 09/22/2019 5.6  1.7 - 7.7 K/uL Final  . Lymphocytes Relative 09/22/2019 31  % Final  . Lymphs Abs 09/22/2019 3.0  0.7 - 4.0 K/uL Final  . Monocytes Relative 09/22/2019 9  % Final  . Monocytes Absolute 09/22/2019 0.8  0.1 - 1.0 K/uL Final  . Eosinophils Relative 09/22/2019 2  % Final  . Eosinophils Absolute 09/22/2019 0.1  0.0 - 0.5 K/uL Final  . Basophils Relative 09/22/2019 1  % Final  . Basophils Absolute 09/22/2019 0.1  0.0 - 0.1 K/uL Final  . Immature Granulocytes 09/22/2019 0  % Final  . Abs Immature Granulocytes 09/22/2019 0.03  0.00 - 0.07 K/uL Final   Performed at [facility], [address] [place], [place] [address]    Assessment:  NICKEY CANEDO is a 43 y.o. female with thrombocytopenia.  Platelet count was 76,000 on 08/25/2019. MPV was 13.8 (6-12).  Patient denies any new medications or herbal products.  Labs on 08/25/2019 included hematocrit 37.0, hemoglobin 12.0, MCV 96.0, platelets 76,000, and WBC 8,600 with an [place] of 4900.  Differential included 57% segs, 32% lymphs, 8% monocytes and 2%  eosinophils.  Normal studies included:  Creatinine (0.81), LFTs, vitamin B12 (1019), folate (> 24.00).  Ferritin was 26.1 with an iron saturation of 28% and a TIBC of 423.  Uric acid was 7.1 (2.3-6.6). TSH was 2.829 with a T3 of 81 (87-178) and a T4 9.66 (6.1-12.2).   Work-up on 09/22/2019 revealed a hematocrit 37, hemoglobin 12.3, MCV 92.5, platelets 76,000, WBC 9600, ANC 5600. Platelet count in citrate tube was  62,000. Normal studies included:  ANA , PT, PTT, hepatitis B surface antigen, hepatitis B core antibody, hepatitis C antibody, and HIV testing.  Peripheral smear revealed thrombocytopenia with unremarkable platelet morphology.   She has vitamin B deficiency.  She is on oral B12.  She is on oral iron.  Symptomatically, she is doing well.  She denies any excess bruising or bleeding.  Plan: 1.   Thrombocytopenia             Work-up is negative to date.  No new medications or herbal products implicated.  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   Discuss diagnosis of excluuion.   Discuss obtaining an abdominal ultrasound to assess spleen size (should be normal).             Patient notes low platelets have been associated with menses x 2.   Check CBC on 10/12/2019. 2.   Hypertension             Blood pressure better controlled. 3.   RTC on 10/12/2019 for labs (CBC with diff). 4.   Abdominal ultrasound- assess spleen size. 5.   RTC in 1 month for MD assessment, labs (CBC with diff), and review of abdominal ultrasound.  I discussed the assessment and treatment plan with the patient.  The patient was provided an opportunity to ask questions and all were answered.  The patient agreed with the plan and demonstrated an understanding of the instructions.  The patient was advised to call back if the symptoms worsen or if the condition fails to improve as anticipated.  I provided 16 minutes (1:55 PM - 2:10 PM) of face-to-face time during this this encounter  and > 50% was spent counseling as documented under my assessment and plan.    Rosey Bath, MD, PhD    09/29/2019, 2:07 PM  I, Arnette Norris, am acting as a scribe for Rosey Bath, MD.  I, Melissa C. Merlene Pulling, MD, have reviewed the above documentation for accuracy and completeness, and I agree with the above.

## 2019-09-29 ENCOUNTER — Inpatient Hospital Stay (HOSPITAL_BASED_OUTPATIENT_CLINIC_OR_DEPARTMENT_OTHER): Payer: 59 | Admitting: Hematology and Oncology

## 2019-09-29 ENCOUNTER — Encounter: Payer: Self-pay | Admitting: Hematology and Oncology

## 2019-09-29 DIAGNOSIS — D696 Thrombocytopenia, unspecified: Secondary | ICD-10-CM | POA: Diagnosis not present

## 2019-09-29 NOTE — Patient Instructions (Signed)
Current working diagnosis is immune mediated thrombocytopenic purpura (ITP).   Idiopathic Thrombocytopenic Purpura Idiopathic thrombocytopenic purpura (ITP) is a disease in which the body's disease-fighting system (immune system) attacks platelets in the body. Platelets are blood cells that clump together to form clots. Blood clots help stop bleeding in the body. A person with ITP has too few platelets. As a result, it is harder for the blood to clot. A person may bruise and bleed easily, such as bleeding a lot from minor cuts and scrapes. ITP can affect both children and adults. It is usually a short-term (acute) condition in children and a long-term (chronic) condition in adults. What are the causes? The cause of ITP is not known. In some cases, this condition may develop:  After a viral infection.  During pregnancy.  After developing an immune system disorder. What increases the risk? You may be more likely to develop this condition if you:  Are female.  Are 64-75 years old. What are the signs or symptoms? If you have a mild case, you may not have any symptoms. In more serious cases, symptoms may include:  Bruising easily.  Minor injuries, like cuts and scrapes, that bleed for a long time.  Small red or purple dots under your skin (petechiae), especially on your shins.  Blood in the urine or stool (feces).  Nosebleeds.  Bleeding gums.  Heavy menstrual periods in women. How is this diagnosed? This condition may be diagnosed based on:  Your symptoms and medical history.  A physical exam.  Blood tests.  Tests of the spongy tissue inside your bones (bone marrow). How is this treated? Treatment depends on how severe your condition is. Treatment may include:  Monitoring your symptoms and your platelet count over time. You may need to see your health care provider for blood tests on a regular basis.  Receiving donated blood products (transfusions), such as platelets.   Medicines to: ? Reduce inflammation (steroids). ? Increase how many platelets your body makes. ? Reduce the activity of your immune system.  Surgery to remove your spleen, if other treatments are not effective. The spleen is an organ in your upper left abdomen. It stores blood cells and is involved in some immune system functions. In ITP, the spleen releases proteins (antibodies) that mistakenly attack platelets. Follow these instructions at home: Medicines   Take over-the-counter and prescription medicines only as told by your health care provider. Do not take the following unless your health care provider approves: ? Over-the-counter medicines that contain aspirin. ? NSAIDs such as ibuprofen and naproxen.  Talk with your health care provider before you take any new medicines. Certain medicines may increase your risk for dangerous bleeding. Preventing falls   Follow instructions from your health care provider about ways that you can help prevent falls and injuries at home. These may include: ? Removing loose rugs, cords, and other tripping hazards from walkways. ? Installing grab bars in bathrooms. ? Using night-lights. General instructions  Tell all your health care providers, including your dentist, that you have a bleeding disorder. Make sure to tell providers before you have any procedure done, including dental cleanings.  Do not play contact sports or do activities that have a high risk for injury or bruising. Ask your health care provider what activities are safe for you.  Brush your teeth using a soft toothbrush.  When shaving, use an electric razor instead of a blade.  Wear a medical alert bracelet that says that you have a  bleeding disorder. This can help you get the treatment you need in case of emergency.  Keep all follow-up visits as told by your health care provider. This is important. You may need regular blood tests. Contact a health care provider if you have:   New symptoms.  Symptoms that get worse.  A fever. Get help right away if you have:  A sudden, severe headache.  Sudden, severe nausea.  Severe bleeding.  Vomiting. Summary  Idiopathic thrombocytopenic purpura (ITP) is a disease in which the body's disease-fighting system (immune system) attacks blood cells that form clots to help stop bleeding in the body (platelets).  ITP can lead to bruising and bleeding easily, including frequent nosebleeds, blood in the urine or stool, and bleeding gums. In women, ITP can lead to heavy menstrual periods.  Treatment depends on how severe your condition is. It may include steroid therapy and medicines that reduce the activity of the immune system. In some cases, surgery is needed to remove the spleen.  Follow your health care provider's instructions about taking medicines, preventing falls, restricting some activities, and when to get help. This information is not intended to replace advice given to you by your health care provider. Make sure you discuss any questions you have with your health care provider. Document Released: 06/13/2014 Document Revised: 01/09/2019 Document Reviewed: 11/27/2017 Elsevier Patient Education  2020 [employer].

## 2019-10-06 ENCOUNTER — Ambulatory Visit: Payer: 59

## 2019-10-12 ENCOUNTER — Inpatient Hospital Stay: Payer: 59 | Attending: Hematology and Oncology

## 2019-10-30 ENCOUNTER — Inpatient Hospital Stay: Payer: 59 | Attending: Hematology and Oncology

## 2019-10-30 ENCOUNTER — Inpatient Hospital Stay: Payer: 59 | Attending: Hematology and Oncology | Admitting: Hematology and Oncology

## 2019-10-30 ENCOUNTER — Encounter: Payer: Self-pay | Admitting: Hematology and Oncology

## 2020-02-16 ENCOUNTER — Ambulatory Visit: Payer: 59

## 2020-02-17 ENCOUNTER — Other Ambulatory Visit: Payer: Self-pay

## 2020-02-17 ENCOUNTER — Ambulatory Visit: Payer: 59 | Attending: Internal Medicine

## 2020-02-17 DIAGNOSIS — Z23 Encounter for immunization: Secondary | ICD-10-CM

## 2020-02-17 NOTE — Progress Notes (Signed)
   Covid-19 Vaccination Clinic  Name:  Taylor Rodgers    MRN: [id number] DOB: Dec 24, 1975  02/17/2020  [name] was observed post Covid-19 immunization for 15 minutes without incident. She was provided with Vaccine Information Sheet and instruction to access the V-Safe system.   [name] was instructed to call 911 with any severe reactions post vaccine: Marland Kitchen Difficulty breathing  . Swelling of face and throat  . A fast heartbeat  . A bad rash all over body  . Dizziness and weakness   Immunizations Administered    Name Date Dose VIS Date Route   Pfizer COVID-19 Vaccine 02/17/2020 10:14 AM 0.3 mL 11/10/2019 Intramuscular   Manufacturer: [employer], [employer]   Lot: [id number]   NDC: [address]-5

## 2020-03-13 ENCOUNTER — Ambulatory Visit: Payer: 59 | Attending: Internal Medicine

## 2020-03-13 DIAGNOSIS — Z23 Encounter for immunization: Secondary | ICD-10-CM

## 2020-03-13 NOTE — Progress Notes (Signed)
   Covid-19 Vaccination Clinic  Name:  Taylor Rodgers    MRN: [id number] DOB: 1976/07/12  03/13/2020  [name] was observed post Covid-19 immunization for 15 minutes without incident. She was provided with Vaccine Information Sheet and instruction to access the V-Safe system.   [name] was instructed to call 911 with any severe reactions post vaccine: Marland Kitchen Difficulty breathing  . Swelling of face and throat  . A fast heartbeat  . A bad rash all over body  . Dizziness and weakness   Immunizations Administered    Name Date Dose VIS Date Route   Pfizer COVID-19 Vaccine 03/13/2020 12:39 PM 0.3 mL 11/10/2019 Intramuscular   Manufacturer: [employer], [employer]   Lot: [id number]   NDC: [address]-5

## 2020-09-27 ENCOUNTER — Telehealth: Payer: Self-pay | Admitting: Hematology and Oncology

## 2020-09-27 NOTE — Telephone Encounter (Signed)
I called patient twice and left 2 VM to call me to schedule an appt for low Blood plts.

## 2020-10-31 ENCOUNTER — Inpatient Hospital Stay: Payer: 59 | Attending: Hematology and Oncology | Admitting: Hematology and Oncology

## 2020-10-31 DIAGNOSIS — E538 Deficiency of other specified B group vitamins: Secondary | ICD-10-CM | POA: Insufficient documentation

## 2020-11-01 ENCOUNTER — Encounter: Payer: Self-pay | Admitting: Obstetrics and Gynecology

## 2020-11-13 ENCOUNTER — Encounter: Payer: Self-pay | Admitting: Obstetrics and Gynecology

## 2020-11-14 ENCOUNTER — Other Ambulatory Visit (HOSPITAL_COMMUNITY)
Admission: RE | Admit: 2020-11-14 | Discharge: 2020-11-14 | Disposition: A | Discharge status: Home / Self Care | Payer: 59 | Source: Ambulatory Visit | Attending: Obstetrics and Gynecology | Admitting: Obstetrics and Gynecology

## 2020-11-14 ENCOUNTER — Encounter: Payer: Self-pay | Admitting: Obstetrics and Gynecology

## 2020-11-14 ENCOUNTER — Ambulatory Visit (INDEPENDENT_AMBULATORY_CARE_PROVIDER_SITE_OTHER): Payer: 59 | Admitting: Obstetrics and Gynecology

## 2020-11-14 ENCOUNTER — Other Ambulatory Visit: Payer: Self-pay

## 2020-11-14 VITALS — BP 210/101 | HR 66 | Ht 68.0 in | Wt 352.0 lb

## 2020-11-14 DIAGNOSIS — Z124 Encounter for screening for malignant neoplasm of cervix: Secondary | ICD-10-CM | POA: Diagnosis present

## 2020-11-14 DIAGNOSIS — N760 Acute vaginitis: Secondary | ICD-10-CM

## 2020-11-14 DIAGNOSIS — Z1231 Encounter for screening mammogram for malignant neoplasm of breast: Secondary | ICD-10-CM | POA: Diagnosis not present

## 2020-11-14 DIAGNOSIS — B9689 Other specified bacterial agents as the cause of diseases classified elsewhere: Secondary | ICD-10-CM

## 2020-11-14 DIAGNOSIS — Z01419 Encounter for gynecological examination (general) (routine) without abnormal findings: Secondary | ICD-10-CM

## 2020-11-14 NOTE — Addendum Note (Signed)
Addended by: Dorian Pod on: 11/14/2020 02:03 PM   Modules accepted: Orders

## 2020-11-14 NOTE — Progress Notes (Signed)
HPI:      [name] is a 44 y.o. No obstetric history on file. who LMP was Patient's last menstrual period was 10/30/2020.  Subjective:   She presents today for her annual examination.  She has no complaints today.  She does describe a history of chronic bacterial vaginosis.  She would like to have discussion regarding this.  She has previously tried Flagyl, clindamycin, MetroGel, probiotics, and has been over all the other possible causes of recurrent BV.  She is currently in the middle of using long-term boric acid capsules. Of significant note patient using vaginal insert for birth control. She states that she is due for mammogram and Pap smear. She is having normal regular menstrual cycles. Patient has hypertension, diabetes, and elevated cholesterol.    Hx: The following portions of the patient's history were reviewed and updated as appropriate:             She  has a past medical history of Diabetes mellitus without complication (HCC), Gout, and Hypertension. She does not have any pertinent problems on file. She  has a past surgical history that includes Tonsillectomy. Her family history includes Cancer in her father and mother; Hypertension in her mother. She  reports that she has been smoking cigarettes. She has never used smokeless tobacco. She reports current alcohol use. She reports that she does not use drugs. She has a current medication list which includes the following prescription(s): allopurinol, atenolol, chlorthalidone, vitamin d3, cyanocobalamin, docusate sodium, edarbyclor, ferosul, metformin, potassium chloride sa, pravastatin, levocetirizine, [DISCONTINUED] carvedilol, [DISCONTINUED] enalapril, [DISCONTINUED] hydrochlorothiazide, and [DISCONTINUED] potassium chloride. She is allergic to gluten meal and other.       Review of Systems:  Review of Systems  Constitutional: Denied constitutional symptoms, night sweats, recent illness, fatigue, fever, insomnia  and weight loss.  Eyes: Denied eye symptoms, eye pain, photophobia, vision change and visual disturbance.  Ears/Nose/Throat/Neck: Denied ear, nose, throat or neck symptoms, hearing loss, nasal discharge, sinus congestion and sore throat.  Cardiovascular: Denied cardiovascular symptoms, arrhythmia, chest pain/pressure, edema, exercise intolerance, orthopnea and palpitations.  Respiratory: Denied pulmonary symptoms, asthma, pleuritic pain, productive sputum, cough, dyspnea and wheezing.  Gastrointestinal: Denied, gastro-esophageal reflux, melena, nausea and vomiting.  Genitourinary: Denied genitourinary symptoms including symptomatic vaginal discharge, pelvic relaxation issues, and urinary complaints.  Musculoskeletal: Denied musculoskeletal symptoms, stiffness, swelling, muscle weakness and myalgia.  Dermatologic: Denied dermatology symptoms, rash and scar.  Neurologic: Denied neurology symptoms, dizziness, headache, neck pain and syncope.  Psychiatric: Denied psychiatric symptoms, anxiety and depression.  Endocrine: Denied endocrine symptoms including hot flashes and night sweats.   Meds:   Current Outpatient Medications on File Prior to Visit  Medication Sig Dispense Refill  . allopurinol (ZYLOPRIM) 300 MG tablet Take 300 mg by mouth daily.  2  . atenolol (TENORMIN) 100 MG tablet Take 100 mg by mouth 2 (two) times daily.  2  . chlorthalidone (HYGROTON) 50 MG tablet Take 50 mg by mouth daily.     . Cholecalciferol (VITAMIN D3) 25 MCG (1000 UT) CAPS Take 1 capsule by mouth daily.     . cyanocobalamin (,VITAMIN B-12,) 1000 MCG/ML injection INJECT 1 ML ONCE EVERY MONTH    . docusate sodium (COLACE) 100 MG capsule Take 100 mg by mouth daily.    Marland Kitchen EDARBYCLOR 40-25 MG TABS Take 1 tablet by mouth daily.    . FEROSUL 325 (65 Fe) MG tablet Take 325 mg by mouth daily.    . metFORMIN (GLUCOPHAGE) 500 MG tablet  Take 500 mg by mouth 2 (two) times daily.  2  . potassium chloride SA (KLOR-CON) 20 MEQ  tablet Take 20 mEq by mouth daily.     . pravastatin (PRAVACHOL) 20 MG tablet Take 20 mg by mouth every evening.  0  . levocetirizine (XYZAL) 5 MG tablet Take 5 mg by mouth daily.  5  . [DISCONTINUED] carvedilol (COREG) 25 MG tablet Take 25 mg by mouth 2 (two) times daily.  2  . [DISCONTINUED] enalapril (VASOTEC) 20 MG tablet Take 20 mg by mouth 2 (two) times daily.  2  . [DISCONTINUED] hydrochlorothiazide (HYDRODIURIL) 50 MG tablet Take 50 mg by mouth daily.  2  . [DISCONTINUED] potassium chloride (K-DUR) 10 MEQ tablet Take 10 mEq by mouth daily.  2   No current facility-administered medications on file prior to visit.          Objective:     Vitals:   11/14/20 1115  BP: (!) 210/101  Pulse: 66    Filed Weights   11/14/20 1115  Weight: (!) 352 lb (159.7 kg)              Physical examination General NAD, Conversant  HEENT Atraumatic; Op clear with mmm.  Normo-cephalic. Pupils reactive. Anicteric sclerae  Thyroid/Neck Smooth without nodularity or enlargement. Normal ROM.  Neck Supple.  Skin No rashes, lesions or ulceration. Normal palpated skin turgor. No nodularity.  Breasts: No masses or discharge.  Symmetric.  No axillary adenopathy.  Lungs: Clear to auscultation.No rales or wheezes. Normal Respiratory effort, no retractions.  Heart: NSR.  No murmurs or rubs appreciated. No periferal edema  Abdomen: Soft.  Non-tender.  No masses.  No HSM. No hernia  Extremities: Moves all appropriately.  Normal ROM for age. No lymphadenopathy.  Neuro: Oriented to PPT.  Normal mood. Normal affect.     Pelvic:   Vulva: Normal appearance.  No lesions.  Vagina: No lesions or abnormalities noted.  Support: Normal pelvic support.  Urethra No masses tenderness or scarring.  Meatus Normal size without lesions or prolapse.  Cervix: Normal appearance.  No lesions.  Anus: Normal exam.  No lesions.  Perineum: Normal exam.  No lesions.        Bimanual   Uterus: Normal size.  Non-tender.  Mobile.   AV.  Adnexae: No masses.  Non-tender to palpation.  Cul-de-sac: Negative for abnormality.    Exam limited by patient body habitus  Assessment:    No obstetric history on file. Patient Active Problem List   Diagnosis Date Noted  . B12 deficiency 10/31/2020  . Thrombocytopenia (HCC) 09/05/2019     1. Well woman exam with routine gynecological exam   2. Encounter for screening mammogram for malignant neoplasm of breast   3. Morbid obesity (HCC)   4. Bacterial vulvovaginitis     Patient has likely chronic BV -being treated well and correctly by PCP.  Now in the middle of boric acid trial.   Plan:            1.  Basic Screening Recommendations The basic screening recommendations for asymptomatic women were discussed with the patient during her visit.  The age-appropriate recommendations were discussed with her and the rational for the tests reviewed.  When I am informed by the patient that another primary care physician has previously obtained the age-appropriate tests and they are up-to-date, only outstanding tests are ordered and referrals given as necessary.  Abnormal results of tests will be discussed with her when all of  her results are completed.  Routine preventative health maintenance measures emphasized: Exercise/Diet/Weight control, Tobacco Warnings, Alcohol/Substance use risks and Stress Management Pap performed-mammogram ordered.  PCP does lab work. Orders Orders Placed This Encounter  Procedures  . MM 3D SCREEN BREAST BILATERAL    No orders of the defined types were placed in this encounter.           F/U  Return in about 1 year (around 11/14/2021) for Annual Physical.  Elonda Husky, M.D. 11/14/2020 12:28 PM

## 2020-11-18 LAB — CYTOLOGY - PAP
Adequacy: ABSENT
Comment: NEGATIVE
Diagnosis: NEGATIVE
High risk HPV: NEGATIVE

## 2021-01-20 ENCOUNTER — Ambulatory Visit
Admission: RE | Admit: 2021-01-20 | Discharge: 2021-01-20 | Disposition: A | Discharge status: Home / Self Care | Payer: 59 | Source: Ambulatory Visit | Attending: Obstetrics and Gynecology | Admitting: Obstetrics and Gynecology

## 2021-01-20 ENCOUNTER — Other Ambulatory Visit: Payer: Self-pay

## 2021-01-20 DIAGNOSIS — Z1231 Encounter for screening mammogram for malignant neoplasm of breast: Secondary | ICD-10-CM | POA: Insufficient documentation

## 2022-03-31 IMAGING — MG MM DIGITAL SCREENING BILAT W/ TOMO AND CAD
6 of 12 series · 6 of 36 positions shown · non-contrast
Comparison: Previous exam(s).

CLINICAL DATA: Screening.

EXAM:
DIGITAL SCREENING BILATERAL MAMMOGRAM WITH TOMOSYNTHESIS AND CAD
TECHNIQUE: Bilateral screening digital craniocaudal and mediolateral oblique
mammograms were obtained. Bilateral screening digital breast
tomosynthesis was performed. The images were evaluated with
computer-aided detection.

[L MLO synth-2D (1 of 2)]
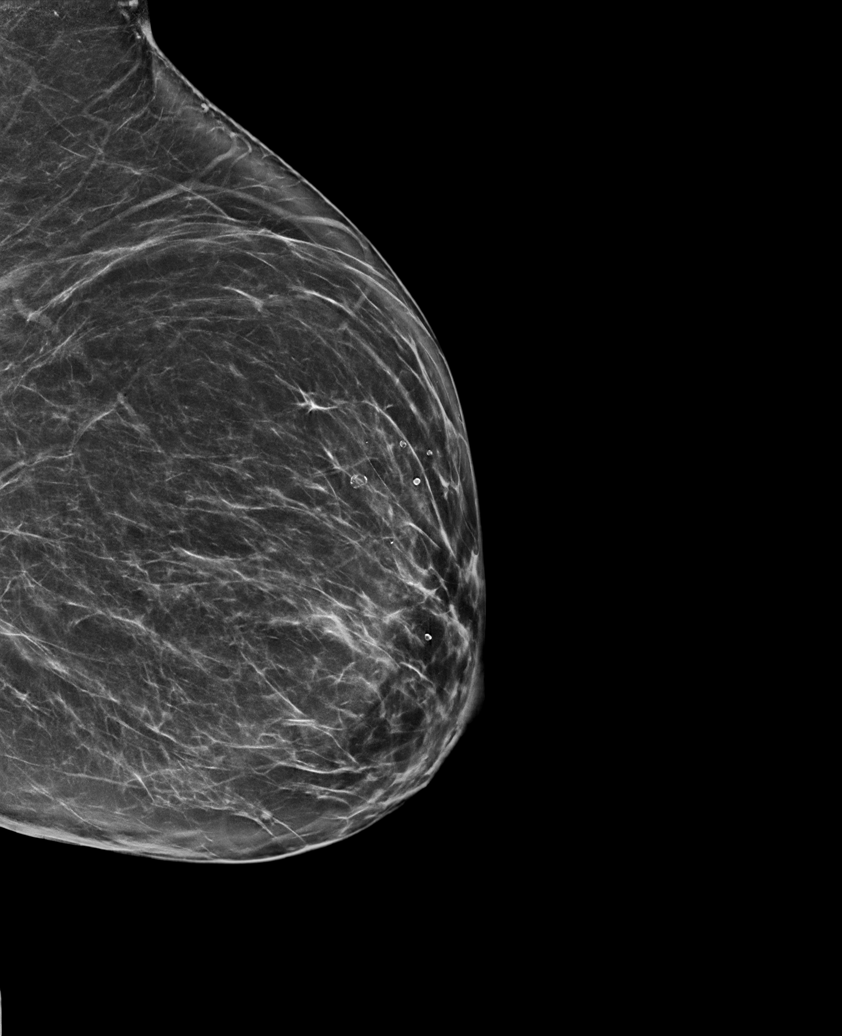

[R CC synth-2D]
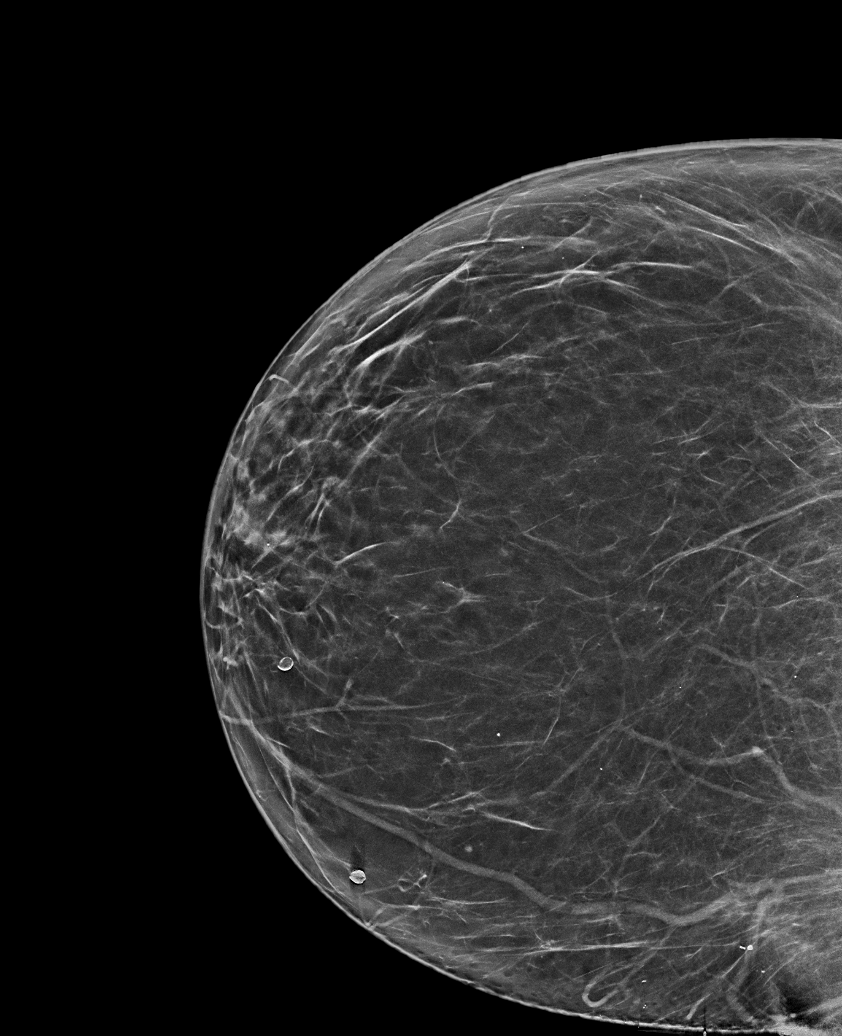

[R MLO synth-2D (1 of 2)]
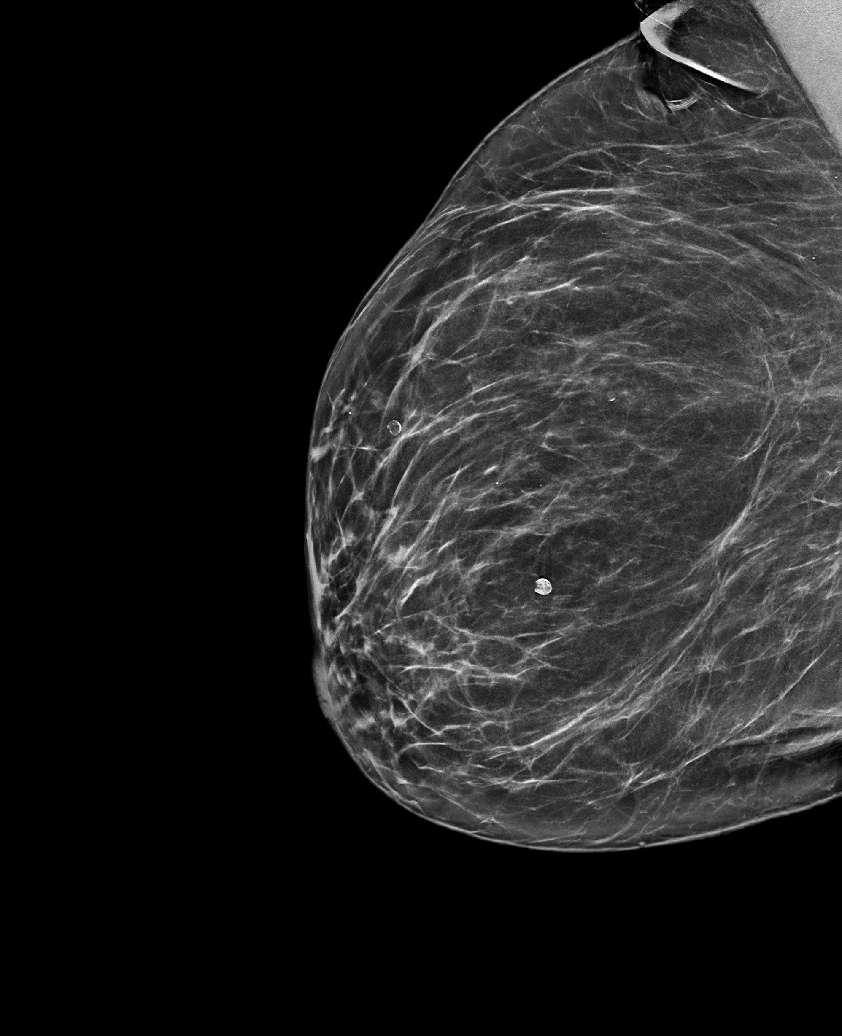

[L MLO synth-2D (2 of 2)]
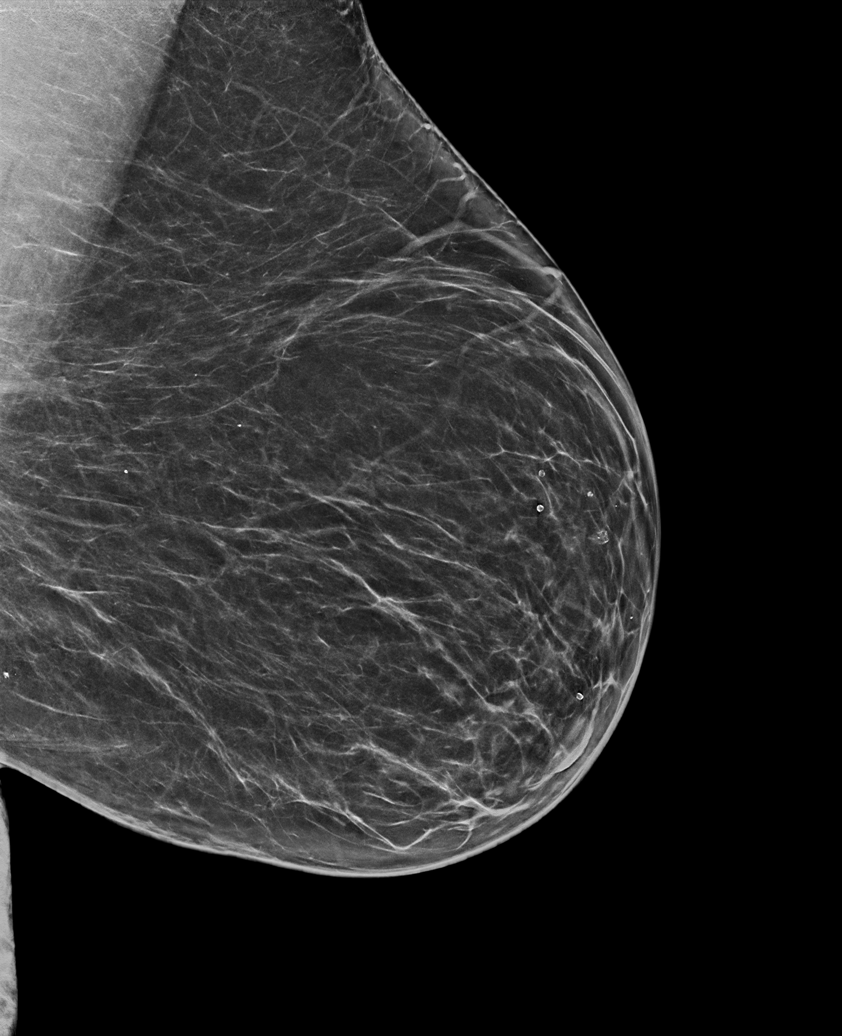

[L CC synth-2D]
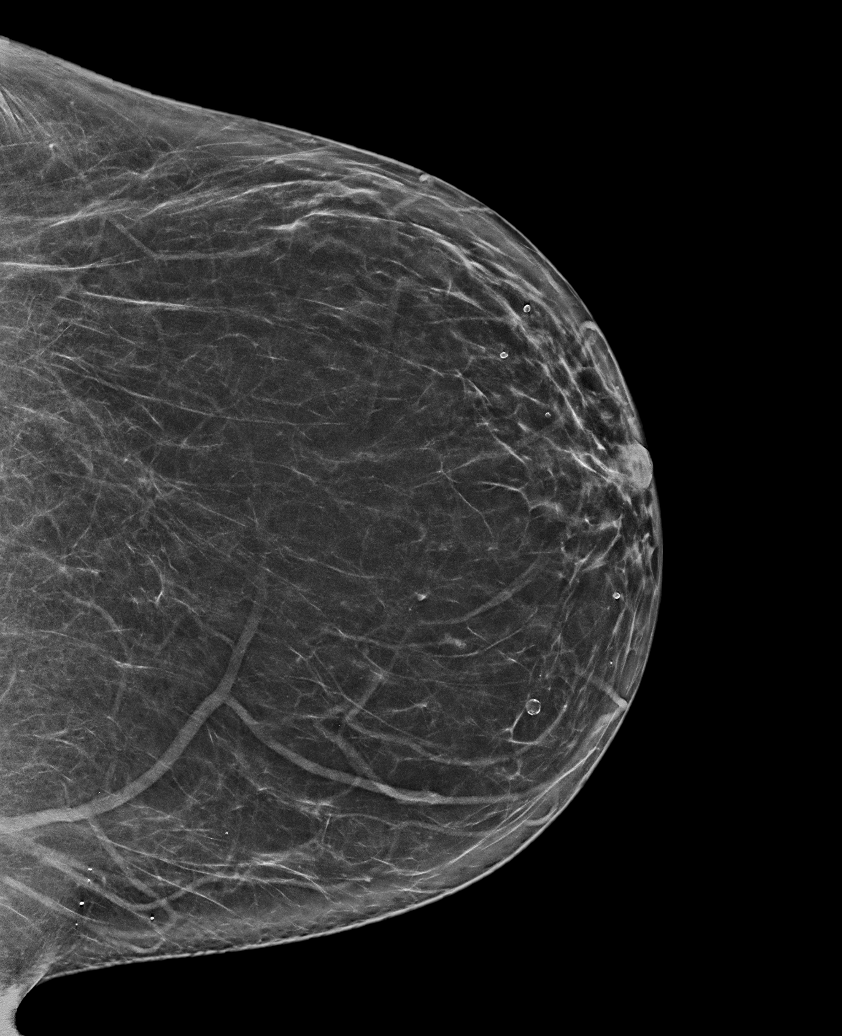

[R MLO synth-2D (2 of 2)]
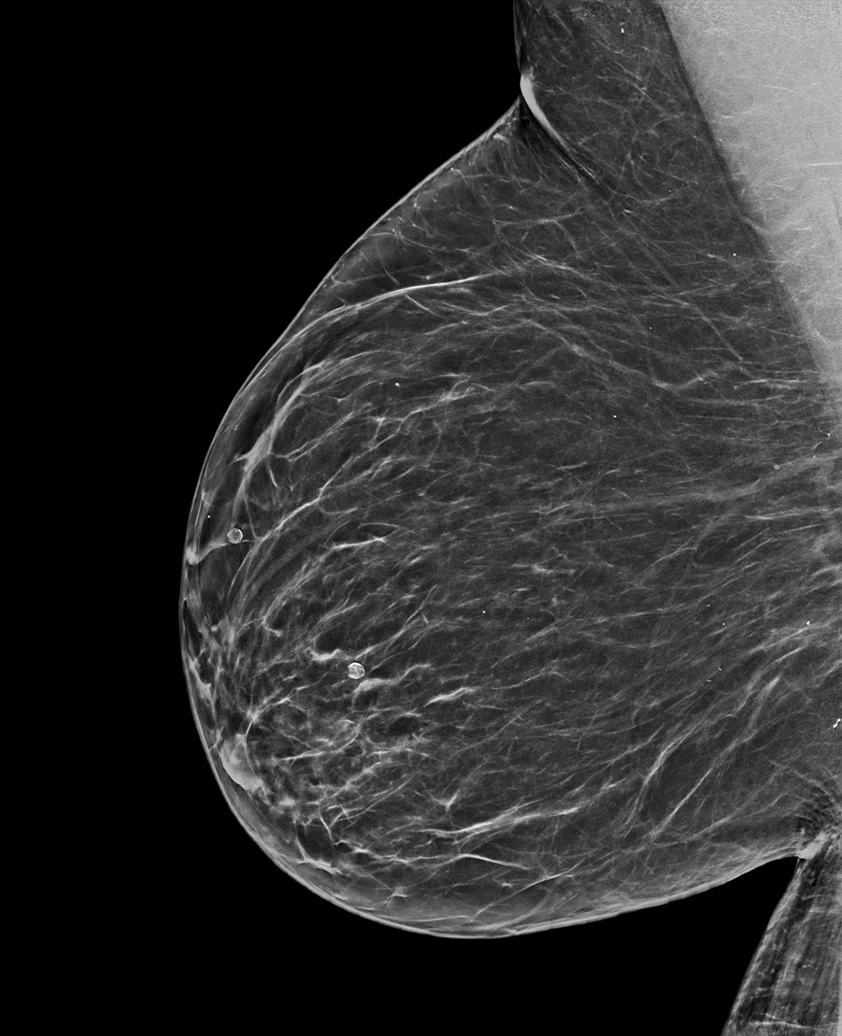

[6 of 36 positions shown; findings below may reference images not displayed]

ACR Breast Density Category b: There are scattered areas of
fibroglandular density.
FINDINGS: There are no findings suspicious for malignancy.
IMPRESSION: No mammographic evidence of malignancy. A result letter of this
screening mammogram will be mailed directly to the patient.

RECOMMENDATION:
Screening mammogram in one year. (Code:51-O-LD2)

BI-RADS CATEGORY  1: Negative.

## 2024-03-03 ENCOUNTER — Ambulatory Visit (INDEPENDENT_AMBULATORY_CARE_PROVIDER_SITE_OTHER)

## 2024-03-03 ENCOUNTER — Ambulatory Visit
Admission: EM | Admit: 2024-03-03 | Discharge: 2024-03-03 | Disposition: A | Discharge status: Home / Self Care | Attending: Family Medicine | Admitting: Family Medicine

## 2024-03-03 DIAGNOSIS — W19XXXA Unspecified fall, initial encounter: Secondary | ICD-10-CM

## 2024-03-03 DIAGNOSIS — M25562 Pain in left knee: Secondary | ICD-10-CM

## 2024-03-03 DIAGNOSIS — I10 Essential (primary) hypertension: Secondary | ICD-10-CM

## 2024-03-03 DIAGNOSIS — Z8739 Personal history of other diseases of the musculoskeletal system and connective tissue: Secondary | ICD-10-CM

## 2024-03-03 MED ORDER — ACETAMINOPHEN 500 MG PO TABS: 1000.0000 mg | ORAL_TABLET | Freq: Once | ORAL | Status: DC

## 2024-03-03 MED ORDER — COLCHICINE 0.6 MG PO TABS: ORAL_TABLET | ORAL | 0 refills | Status: AC

## 2024-03-03 MED ORDER — IBUPROFEN 800 MG PO TABS: 800.0000 mg | ORAL_TABLET | Freq: Three times a day (TID) | ORAL | 0 refills | Status: AC

## 2024-03-03 MED ORDER — ACETAMINOPHEN 325 MG PO TABS
975.0000 mg | ORAL_TABLET | Freq: Once | ORAL | Status: AC
  Administered 2024-03-03: 975 mg via ORAL

## 2024-03-03 NOTE — ED Provider Notes (Signed)
 [facility]    CSN: [id number] Arrival date & time: 03/03/24  1220      History   Chief Complaint Chief Complaint  Patient presents with   Gout    HPI  HPI Taylor Rodgers is a 48 y.o. female.   Taylor Rodgers presents after a fall Tuesday night.  She took her gout medication then.  The next day she fell in the shower again.  Has left knee pain and has been taking gout medication for a flare up.She is still sore and hasn't taken anymore flare up medication. Has been using her walking cane since she is in  gout flare up. She ate some lamb meatballs recently and they through her into a gout flare. Taking ibuprofen and Tylenol without relief.   Did not hit her head during the fall nor did she have loss of consciousness.     Past Medical History:  Diagnosis Date   Diabetes mellitus without complication (HCC)    Gout    Hypertension     Patient Active Problem List   Diagnosis Date Noted   B12 deficiency 10/31/2020   Thrombocytopenia (HCC) 09/05/2019    Past Surgical History:  Procedure Laterality Date   TONSILLECTOMY      OB History   No obstetric history on file.      Home Medications    Prior to Admission medications   Medication Sig Start Date End Date Taking? Authorizing Provider  allopurinol (ZYLOPRIM) 300 MG tablet Take 300 mg by mouth daily. 08/11/18  Yes [provider]  atenolol (TENORMIN) 100 MG tablet Take 100 mg by mouth 2 (two) times daily. 08/11/18  Yes [provider]  chlorthalidone (HYGROTON) 50 MG tablet Take 50 mg by mouth daily.  09/28/19  Yes [provider]  Cholecalciferol (VITAMIN D3) 25 MCG (1000 UT) CAPS Take 1 capsule by mouth daily.    Yes [provider]  colchicine 0.6 MG tablet Take 2 tablets then an hour later take another tablet. The next day take one tablet daily until complete. 03/03/24  Yes Kahley Leib, DO  cyanocobalamin (,VITAMIN B-12,) 1000 MCG/ML injection INJECT 1 ML ONCE EVERY  MONTH 06/20/19  Yes [provider]  docusate sodium (COLACE) 100 MG capsule Take 100 mg by mouth daily.   Yes [provider]  FEROSUL 325 (65 Fe) MG tablet Take 325 mg by mouth daily. 08/01/19  Yes [provider]  ibuprofen (ADVIL) 800 MG tablet Take 1 tablet (800 mg total) by mouth 3 (three) times daily. 03/03/24  Yes Selinda Korzeniewski, DO  levocetirizine (XYZAL) 5 MG tablet Take 5 mg by mouth daily. 08/08/18  Yes [provider]  metFORMIN (GLUCOPHAGE) 500 MG tablet Take 500 mg by mouth 2 (two) times daily. 08/06/18  Yes [provider]  potassium chloride SA (KLOR-CON) 20 MEQ tablet Take 20 mEq by mouth daily.  08/28/19  Yes [provider]  rosuvastatin (CRESTOR) 40 MG tablet Take by mouth. 05/24/23  Yes [provider]  EDARBYCLOR 40-25 MG TABS Take 1 tablet by mouth daily. 08/23/19   [provider]  pravastatin (PRAVACHOL) 20 MG tablet Take 20 mg by mouth every evening. 08/11/18   [provider]  carvedilol (COREG) 25 MG tablet Take 25 mg by mouth 2 (two) times daily. 08/11/18 09/05/19  [provider]  enalapril (VASOTEC) 20 MG tablet Take 20 mg by mouth 2 (two) times daily. 08/11/18 09/05/19  [provider]  hydrochlorothiazide (HYDRODIURIL) 50 MG  tablet Take 50 mg by mouth daily. 07/23/18 09/05/19  [provider]  potassium chloride (K-DUR) 10 MEQ tablet Take 10 mEq by mouth daily. 08/12/18 09/22/19  [provider]    Family History Family History  Problem Relation Age of Onset   Hypertension Mother    Cancer Mother    Cancer Father     Social History Social History   Tobacco Use   Smoking status: Some Days    Types: Cigarettes   Smokeless tobacco: Never  Vaping Use   Vaping status: Never Used  Substance Use Topics   Alcohol use: Yes   Drug use: Never     Allergies   Gluten meal and Other   Review of Systems Review of Systems: :negative unless otherwise stated in  HPI.      Physical Exam Triage Vital Signs ED Triage Vitals  Encounter Vitals Group     BP 03/03/24 1258 (!) 179/116     Systolic BP Percentile --      Diastolic BP Percentile --      Pulse Rate 03/03/24 1258 65     Resp --      Temp 03/03/24 1258 98.4 F (36.9 C)     Temp Source 03/03/24 1258 Oral     SpO2 03/03/24 1258 96 %     Weight 03/03/24 1259 (!) 312 lb (141.5 kg)     Height 03/03/24 1259 5\' 9"  (1.753 m)     Head Circumference --      Peak Flow --      Pain Score 03/03/24 1257 8     Pain Loc --      Pain Education --      Exclude from Growth Chart --    No data found.  Updated Vital Signs BP (!) 180/119 (BP Location: Right Arm)   Pulse 65   Temp 98.4 F (36.9 C) (Oral)   Ht 5\' 9"  (1.753 m)   Wt (!) 141.5 kg   LMP 12/25/2023   SpO2 96%   BMI 46.07 kg/m   Visual Acuity Right Eye Distance:   Left Eye Distance:   Bilateral Distance:    Right Eye Near:   Left Eye Near:    Bilateral Near:     Physical Exam GEN: well appearing female in no acute distress  CVS: well perfused, regular rate and rhythm RESP: speaking in full sentences without pause, clear, no respiratory distress  MSK: seated in wheelchair   Left Knee Exam -Inspection: genus valgus deformity (not new),  -Palpation: medial joint line tenderness, no tibial tuberosity tenderness, no joint infusion appreciated  -ROM: Extension: 0 degrees; Flexion: 140 degrees.  Strength testing was limited due to pain -Special Tests: Varus Stress: Negative; Valgus Stress: Negative; Anterior: Negative; Posterior drawer: Negative;  Thessaly: not attempted; Patellar grind: Negative -Limb neurovascularly intact, no instability noted  Walking cane present at bedside    UC Treatments / Results  Labs (all labs ordered are listed, but only abnormal results are displayed) Labs Reviewed - No data to display  EKG   Radiology DG Knee Complete 4 Views Left Result Date: 03/03/2024 CLINICAL DATA:  Left knee pain  after fall. EXAM: LEFT KNEE - COMPLETE 4+ VIEW COMPARISON:  None Available. FINDINGS: Severe narrowing of lateral joint space is noted. Large spur is seen arising superiorly from patella with moderate narrowing of patellofemoral space. Moderate narrowing of medial joint space is noted as well centrally. No definite fracture or dislocation is noted. No definite  joint effusion. IMPRESSION: Moderate to severe tricompartmental degenerative joint disease. No acute abnormality seen. Electronically Signed   By: Lupita Raider M.D.   On: 03/03/2024 14:56     Procedures Procedures (including critical care time)  Medications Ordered in UC Medications  acetaminophen (TYLENOL) tablet 975 mg (975 mg Oral Given 03/03/24 1355)    Initial Impression / Assessment and Plan / UC Course  I have reviewed the triage vital signs and the nursing notes.  Pertinent labs & imaging results that were available during my care of the patient were reviewed by me and considered in my medical decision making (see chart for details).      Pt is a 48 y.o.  female with history of gout and osteoarthritis presents for fall with ongoing left knee pain. On exam, pt has tenderness at medial joint line of her knee.    Obtained left knee plain films.  Personally interpreted by me were unremarkable for fracture or dislocation. She has significant osteoarhtritis. Films reviewed with patient at bedside. Patient aware the radiologist has not read her xray and is comfortable with the preliminary read by me. Will review radiologist read when available and call patient if a change in plan is warranted.  Pt agreeable to this plan prior to discharge. Pt placed in knee brace. Declines crutches.    Patient to gradually return to normal activities, as tolerated and continue ordinary activities within the limits permitted by pain. Prescribed ibuprofen and colchicine for pain relief.  Tylenol PRN. Advised patient to avoid OTC NSAIDs while taking  prescription NSAID.  Patient to follow up with orthopedic provider Dr Lenard Forth, if symptoms do not improve with conservative treatment.   Ameliah is hypertensive here.  BP 176/116 then after sitting was 180/119. Discussed utility of steroids for her gout and OA but she has significantly elevated blood pressure. She states she is due to take her medication.  Denies needing refills. Recommended she check herblood pressure and follow up with her primary care provider in the next 2 weeks.    Return and ED precautions given. Understanding voiced. Discussed MDM, treatment plan and plan for follow-up with patient who agrees with plan.   Final Clinical Impressions(s) / UC Diagnoses   Final diagnoses:  Acute pain of left knee  Fall, initial encounter  History of gout  Elevated blood pressure reading with diagnosis of hypertension     Discharge Instructions      Your xray showed some significant arthritis in your left knee. Follow up with Dr Lenard Forth if pain not improving.   Stop by the pharmacy to pick up your prescriptions.  For your  pain, Take 1500 mg Tylenol twice a day, ibuprofen 800 mg 3 times a day,  as needed for pain. Wear your knee brace with activity.  Rest and elevate the affected painful area.  Apply cold compresses intermittently, as needed.  As pain recedes, begin normal activities slowly as tolerated.   Watch for worsening symptoms such as an increasing weakness or loss of sensation, increasing pain and/or the loss of bladder or bowel function. Should any of these occur, go to the emergency department immediately.        ED Prescriptions     Medication Sig Dispense Auth. Provider   colchicine 0.6 MG tablet Take 2 tablets then an hour later take another tablet. The next day take one tablet daily until complete. 9 tablet Aaronmichael Brumbaugh, DO   ibuprofen (ADVIL) 800 MG tablet Take 1 tablet (800  mg total) by mouth 3 (three) times daily. 21 tablet Katha Cabal, DO      PDMP not  reviewed this encounter.   Katha Cabal, DO 03/04/24 1324

## 2024-03-03 NOTE — ED Triage Notes (Signed)
 Pt c/o fall on 4,3,25  Pt states that she has gout and took her gout medication on Wednesday. Pt states that she fell Thursday morning while in the shower,  Pt states that the gout flare up is in the left knee.  Pt denies hitting her head during the fall  Pt states that she hit her knee when she fell and now it hurts when she puts pressure on her leg. Pt is walking with a cane.

## 2024-03-03 NOTE — Discharge Instructions (Addendum)
 Your xray showed some significant arthritis in your left knee. Follow up with Dr Lenard Forth if pain not improving.   Stop by the pharmacy to pick up your prescriptions.  For your  pain, Take 1500 mg Tylenol twice a day, ibuprofen 800 mg 3 times a day,  as needed for pain. Wear your knee brace with activity.  Rest and elevate the affected painful area.  Apply cold compresses intermittently, as needed.  As pain recedes, begin normal activities slowly as tolerated.   Watch for worsening symptoms such as an increasing weakness or loss of sensation, increasing pain and/or the loss of bladder or bowel function. Should any of these occur, go to the emergency department immediately.

## 2024-04-11 ENCOUNTER — Other Ambulatory Visit: Payer: Self-pay | Admitting: Family Medicine

## 2024-04-11 DIAGNOSIS — Z1231 Encounter for screening mammogram for malignant neoplasm of breast: Secondary | ICD-10-CM

## 2024-07-14 ENCOUNTER — Encounter: Payer: Self-pay | Admitting: *Deleted

## 2024-07-21 ENCOUNTER — Ambulatory Visit: Admitting: Anesthesiology

## 2024-07-21 ENCOUNTER — Other Ambulatory Visit: Payer: Self-pay

## 2024-07-21 ENCOUNTER — Encounter
Admission: RE | Disposition: A | Discharge status: Home / Self Care | Payer: Self-pay | Source: Home / Self Care | Attending: Gastroenterology

## 2024-07-21 ENCOUNTER — Ambulatory Visit
Admission: RE | Admit: 2024-07-21 | Discharge: 2024-07-21 | Disposition: A | Discharge status: Home / Self Care | Attending: Gastroenterology | Admitting: Gastroenterology

## 2024-07-21 DIAGNOSIS — Z7985 Long-term (current) use of injectable non-insulin antidiabetic drugs: Secondary | ICD-10-CM | POA: Insufficient documentation

## 2024-07-21 DIAGNOSIS — K2970 Gastritis, unspecified, without bleeding: Secondary | ICD-10-CM | POA: Diagnosis not present

## 2024-07-21 DIAGNOSIS — K2100 Gastro-esophageal reflux disease with esophagitis, without bleeding: Secondary | ICD-10-CM | POA: Diagnosis not present

## 2024-07-21 DIAGNOSIS — Z7984 Long term (current) use of oral hypoglycemic drugs: Secondary | ICD-10-CM | POA: Insufficient documentation

## 2024-07-21 DIAGNOSIS — K640 First degree hemorrhoids: Secondary | ICD-10-CM | POA: Insufficient documentation

## 2024-07-21 DIAGNOSIS — E119 Type 2 diabetes mellitus without complications: Secondary | ICD-10-CM | POA: Diagnosis not present

## 2024-07-21 DIAGNOSIS — I10 Essential (primary) hypertension: Secondary | ICD-10-CM | POA: Diagnosis not present

## 2024-07-21 DIAGNOSIS — F17200 Nicotine dependence, unspecified, uncomplicated: Secondary | ICD-10-CM | POA: Insufficient documentation

## 2024-07-21 DIAGNOSIS — J449 Chronic obstructive pulmonary disease, unspecified: Secondary | ICD-10-CM | POA: Insufficient documentation

## 2024-07-21 DIAGNOSIS — Z800 Family history of malignant neoplasm of digestive organs: Secondary | ICD-10-CM | POA: Diagnosis not present

## 2024-07-21 DIAGNOSIS — Z1211 Encounter for screening for malignant neoplasm of colon: Secondary | ICD-10-CM | POA: Diagnosis present

## 2024-07-21 HISTORY — DX: Unilateral primary osteoarthritis, left knee: M17.12

## 2024-07-21 HISTORY — DX: Thrombocytopenia, unspecified: D69.6

## 2024-07-21 HISTORY — PX: ESOPHAGOGASTRODUODENOSCOPY: SHX5428

## 2024-07-21 HISTORY — PX: COLONOSCOPY: SHX5424

## 2024-07-21 HISTORY — DX: Deficiency of other specified B group vitamins: E53.8

## 2024-07-21 HISTORY — DX: Unilateral primary osteoarthritis, right knee: M17.11

## 2024-07-21 LAB — POCT PREGNANCY, URINE: Preg Test, Ur: NEGATIVE

## 2024-07-21 SURGERY — COLONOSCOPY
Anesthesia: General

## 2024-07-21 MED ORDER — PROPOFOL 500 MG/50ML IV EMUL
INTRAVENOUS | Status: DC | PRN
  Administered 2024-07-21: 140 ug/kg/min via INTRAVENOUS

## 2024-07-21 MED ORDER — SODIUM CHLORIDE 0.9 % IV SOLN: INTRAVENOUS | Status: DC

## 2024-07-21 MED ORDER — PROPOFOL 10 MG/ML IV BOLUS
INTRAVENOUS | Status: DC | PRN
Start: 2024-07-21 — End: 2024-07-21
  Administered 2024-07-21: 70 mg via INTRAVENOUS

## 2024-07-21 MED ORDER — DEXMEDETOMIDINE HCL IN NACL 80 MCG/20ML IV SOLN
INTRAVENOUS | Status: DC | PRN
Start: 2024-07-21 — End: 2024-07-21
  Administered 2024-07-21: 12 ug via INTRAVENOUS

## 2024-07-21 MED ORDER — LIDOCAINE HCL (CARDIAC) PF 100 MG/5ML IV SOSY
PREFILLED_SYRINGE | INTRAVENOUS | Status: DC | PRN
  Administered 2024-07-21: 100 mg via INTRAVENOUS

## 2024-07-21 NOTE — Anesthesia Preprocedure Evaluation (Signed)
 Anesthesia Evaluation  Patient identified by MRN, date of birth, ID band Patient awake    Reviewed: Allergy & Precautions, NPO status , Patient's Chart, lab work & pertinent test results  Airway Mallampati: II  TM Distance: >3 FB Neck ROM: full    Dental  (+) Teeth Intact   Pulmonary neg pulmonary ROS, COPD, Current Smoker and Patient abstained from smoking.   Pulmonary exam normal  + decreased breath sounds      Cardiovascular Exercise Tolerance: Good hypertension, Pt. on medications negative cardio ROS Normal cardiovascular exam Rhythm:Regular Rate:Normal     Neuro/Psych negative neurological ROS  negative psych ROS   GI/Hepatic negative GI ROS, Neg liver ROS,,,  Endo/Other  negative endocrine ROSdiabetes, Type 2, Oral Hypoglycemic Agents  Class 4 obesity  Renal/GU negative Renal ROS  negative genitourinary   Musculoskeletal  (+) Arthritis ,    Abdominal  (+) + obese  Peds  Hematology negative hematology ROS (+)   Anesthesia Other Findings Past Medical History: No date: B12 deficiency No date: Diabetes mellitus without complication (HCC) No date: Gout No date: Hypertension No date: Primary osteoarthritis of left knee No date: Primary osteoarthritis of right knee No date: Thrombocytopenia (HCC)  Past Surgical History: No date: TONSILLECTOMY  BMI    Body Mass Index: 45.92 kg/m      Reproductive/Obstetrics negative OB ROS                              Anesthesia Physical Anesthesia Plan  ASA: 3  Anesthesia Plan: General   Post-op Pain Management:    Induction:   PONV Risk Score and Plan: Propofol  infusion and TIVA  Airway Management Planned: Natural Airway and Nasal Cannula  Additional Equipment:   Intra-op Plan:   Post-operative Plan:   Informed Consent: I have reviewed the patients History and Physical, chart, labs and discussed the procedure including the  risks, benefits and alternatives for the proposed anesthesia with the patient or authorized representative who has indicated his/her understanding and acceptance.     Dental Advisory Given  Plan Discussed with: CRNA  Anesthesia Plan Comments:         Anesthesia Quick Evaluation

## 2024-07-21 NOTE — Interval H&P Note (Signed)
 History and Physical Interval Note:  07/21/2024 11:10 AM  Taylor Rodgers  has presented today for surgery, with the diagnosis of CCA SCREEN,GERD.  The various methods of treatment have been discussed with the patient and family. After consideration of risks, benefits and other options for treatment, the patient has consented to  Procedure(s): COLONOSCOPY (N/A) EGD (ESOPHAGOGASTRODUODENOSCOPY) (N/A) as a surgical intervention.  The patient's history has been reviewed, patient examined, no change in status, stable for surgery.  I have reviewed the patient's chart and labs.  Questions were answered to the patient's satisfaction.     Taylor Rodgers  Ok to proceed with EGD/Colonoscopy

## 2024-07-21 NOTE — Op Note (Signed)
 [facility] Patient Name: Taylor Rodgers Procedure Date: 07/21/2024 11:10 AM MRN: [id number] Account #: [id number] Date of Birth: Mar 02, 1976 Admit Type: Outpatient Age: 48 Room: [facility] ENDO ROOM 3 Gender: Female Note Status: Finalized Instrument Name: Colon Scope [id number] Procedure:             Colonoscopy Indications:           Screening for colorectal malignant neoplasm Providers:             Ole Schick MD, MD Referring MD:          Channing SQUIBB. Donal (Referring MD) Medicines:             Monitored Anesthesia Care Complications:         No immediate complications. Procedure:             Pre-Anesthesia Assessment:                        - Prior to the procedure, a History and Physical was                         performed, and patient medications and allergies were                         reviewed. The patient is competent. The risks and                         benefits of the procedure and the sedation options and                         risks were discussed with the patient. All questions                         were answered and informed consent was obtained.                         Patient identification and proposed procedure were                         verified by the physician, the nurse, the                         anesthesiologist, the anesthetist and the technician                         in the endoscopy suite. Mental Status Examination:                         alert and oriented. Airway Examination: normal                         oropharyngeal airway and neck mobility. Respiratory                         Examination: clear to auscultation. CV Examination:                         normal. Prophylactic Antibiotics: The patient does not  require prophylactic antibiotics. Prior                         Anticoagulants: The patient has taken no anticoagulant                         or antiplatelet agents. ASA Grade  Assessment: III - A                         patient with severe systemic disease. After reviewing                         the risks and benefits, the patient was deemed in                         satisfactory condition to undergo the procedure. The                         anesthesia plan was to use monitored anesthesia care                         (MAC). Immediately prior to administration of                         medications, the patient was re-assessed for adequacy                         to receive sedatives. The heart rate, respiratory                         rate, oxygen saturations, blood pressure, adequacy of                         pulmonary ventilation, and response to care were                         monitored throughout the procedure. The physical                         status of the patient was re-assessed after the                         procedure.                        After obtaining informed consent, the colonoscope was                         passed under direct vision. Throughout the procedure,                         the patient's blood pressure, pulse, and oxygen                         saturations were monitored continuously. The                         Colonoscope was introduced through the anus and  advanced to the the cecum, identified by appendiceal                         orifice and ileocecal valve. The colonoscopy was                         somewhat difficult due to significant looping.                         Successful completion of the procedure was aided by                         applying abdominal pressure. The patient tolerated the                         procedure well. The quality of the bowel preparation                         was good. The ileocecal valve, appendiceal orifice,                         and rectum were photographed. Findings:      The perianal and digital rectal examinations were normal.      Internal  hemorrhoids were found during retroflexion. The hemorrhoids       were Grade I (internal hemorrhoids that do not prolapse).      The exam was otherwise without abnormality on direct and retroflexion       views. Impression:            - Internal hemorrhoids.                        - The examination was otherwise normal on direct and                         retroflexion views.                        - No specimens collected. Recommendation:        - Discharge patient to home.                        - Resume previous diet.                        - Continue present medications.                        - Repeat colonoscopy in 10 years for screening                         purposes.                        - Return to referring physician as previously                         scheduled. Procedure Code(s):     --- Professional ---                        H9878, Colorectal  cancer screening; colonoscopy on                         individual not meeting criteria for high risk Diagnosis Code(s):     --- Professional ---                        Z12.11, Encounter for screening for malignant neoplasm                         of colon                        K64.0, First degree hemorrhoids CPT copyright 2022 American [employer]. All rights reserved. The codes documented in this report are preliminary and upon coder review may  be revised to meet current compliance requirements. Ole Schick MD, MD 07/21/2024 11:45:02 AM Number of Addenda: 0 Note Initiated On: 07/21/2024 11:10 AM Scope Withdrawal Time: 0 hours 6 minutes 21 seconds  Total Procedure Duration: 0 hours 11 minutes 28 seconds  Estimated Blood Loss:  Estimated blood loss: none.      [facility]

## 2024-07-21 NOTE — Transfer of Care (Signed)
 Immediate Anesthesia Transfer of Care Note  Patient: Taylor Rodgers  Procedure(s) Performed: COLONOSCOPY EGD (ESOPHAGOGASTRODUODENOSCOPY)  Patient Location: PACU  Anesthesia Type:General  Level of Consciousness: awake, alert , and oriented  Airway & Oxygen Therapy: Patient Spontanous Breathing  Post-op Assessment: Report given to RN and Post -op Vital signs reviewed and stable  Post vital signs: Reviewed and stable  Last Vitals:  Vitals Value Taken Time  BP 103/66 07/21/24 11:46  Temp 35.9 C 07/21/24 11:43  Pulse 63 07/21/24 11:46  Resp 18 07/21/24 11:46  SpO2 97 % 07/21/24 11:46  Vitals shown include unfiled device data.  Last Pain:  Vitals:   07/21/24 1143  TempSrc: Temporal  PainSc: Asleep         Complications: No notable events documented.

## 2024-07-21 NOTE — Op Note (Signed)
 [facility] Patient Name: Taylor Rodgers Procedure Date: 07/21/2024 11:11 AM MRN: [id number] Account #: [id number] Date of Birth: [birth year] Admit Type: Outpatient Age: 48 Room: [facility] ENDO ROOM 3 Gender: Female Note Status: Finalized Instrument Name: Barnie GI Scope [id number] Procedure:             Upper GI endoscopy Indications:           Gastro-esophageal reflux disease, Family history of                         esophageal cancer Providers:             Ole Schick MD, MD Referring MD:          Channing SQUIBB. Donal (Referring MD) Medicines:             Monitored Anesthesia Care Complications:         No immediate complications. Estimated blood loss:                         Minimal. Procedure:             Pre-Anesthesia Assessment:                        - Prior to the procedure, a History and Physical was                         performed, and patient medications and allergies were                         reviewed. The patient is competent. The risks and                         benefits of the procedure and the sedation options and                         risks were discussed with the patient. All questions                         were answered and informed consent was obtained.                         Patient identification and proposed procedure were                         verified by the physician, the nurse, the                         anesthesiologist, the anesthetist and the technician                         in the endoscopy suite. Mental Status Examination:                         alert and oriented. Airway Examination: normal                         oropharyngeal airway and neck mobility. Respiratory  Examination: clear to auscultation. CV Examination:                         normal. Prophylactic Antibiotics: The patient does not                         require prophylactic antibiotics. Prior                          Anticoagulants: The patient has taken no anticoagulant                         or antiplatelet agents. ASA Grade Assessment: III - A                         patient with severe systemic disease. After reviewing                         the risks and benefits, the patient was deemed in                         satisfactory condition to undergo the procedure. The                         anesthesia plan was to use monitored anesthesia care                         (MAC). Immediately prior to administration of                         medications, the patient was re-assessed for adequacy                         to receive sedatives. The heart rate, respiratory                         rate, oxygen saturations, blood pressure, adequacy of                         pulmonary ventilation, and response to care were                         monitored throughout the procedure. The physical                         status of the patient was re-assessed after the                         procedure.                        After obtaining informed consent, the endoscope was                         passed under direct vision. Throughout the procedure,                         the patient's blood pressure, pulse, and oxygen  saturations were monitored continuously. The Endoscope                         was introduced through the mouth, and advanced to the                         second part of duodenum. The upper GI endoscopy was                         accomplished without difficulty. The patient tolerated                         the procedure well. Findings:      The examined esophagus was normal.      Patchy mild inflammation characterized by erythema was found in the       gastric body and in the gastric antrum. Biopsies were taken with a cold       forceps for Helicobacter pylori testing. Estimated blood loss was       minimal.      The examined duodenum was normal. Impression:             - Normal esophagus.                        - Gastritis. Biopsied.                        - Normal examined duodenum. Recommendation:        - Discharge patient to home.                        - Resume previous diet.                        - Continue present medications.                        - Await pathology results.                        - Return to referring physician as previously                         scheduled. Procedure Code(s):     --- Professional ---                        [phone], Esophagogastroduodenoscopy, flexible,                         transoral; with biopsy, single or multiple Diagnosis Code(s):     --- Professional ---                        K29.70, Gastritis, unspecified, without bleeding                        K21.9, Gastro-esophageal reflux disease without                         esophagitis  Z80.0, Family history of malignant neoplasm of                         digestive organs CPT copyright 2022 American Medical Association. All rights reserved. The codes documented in this report are preliminary and upon coder review may  be revised to meet current compliance requirements. Ole Schick MD, MD 07/21/2024 11:43:17 AM Number of Addenda: 0 Note Initiated On: 07/21/2024 11:11 AM Estimated Blood Loss:  Estimated blood loss was minimal.      [facility]

## 2024-07-21 NOTE — OR Nursing (Signed)
 Per pt, family member ok to wait in lobby until ready to go, family may be visiting another admitted family member. Pt ok with calling family when ready to go.

## 2024-07-21 NOTE — Anesthesia Postprocedure Evaluation (Signed)
 Anesthesia Post Note  Patient: Taylor Rodgers  Procedure(s) Performed: COLONOSCOPY EGD (ESOPHAGOGASTRODUODENOSCOPY)  Patient location during evaluation: PACU Anesthesia Type: General Level of consciousness: awake Pain management: satisfactory to patient Vital Signs Assessment: post-procedure vital signs reviewed and stable Respiratory status: nonlabored ventilation Cardiovascular status: stable Anesthetic complications: no   No notable events documented.   Last Vitals:  Vitals:   07/21/24 1146 07/21/24 1200  BP: 103/66 120/75  Pulse: (!) 56 63  Resp: 16 (!) 24  Temp:    SpO2: 93% 98%    Last Pain:  Vitals:   07/21/24 1200  TempSrc:   PainSc: 0-No pain                 VAN STAVEREN,Gentry Pilson

## 2024-07-21 NOTE — H&P (Signed)
 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 Mother had esophageal cancer. No blood thinners. No history of abdominal surgeries.    Current Facility-Administered Medications:    0.9 %  sodium chloride  infusion, , Intravenous, Continuous, Trestin Vences, Taylor ONEIDA, MD, Last Rate: 20 mL/hr at 07/21/24 1028, New Bag at 07/21/24 1028  Medications Prior to Admission  Medication Sig Dispense Refill Last Dose/Taking   allopurinol (ZYLOPRIM) 300 MG tablet Take 300 mg by mouth daily.  2 07/20/2024   atenolol (TENORMIN) 100 MG tablet Take 100 mg by mouth 2 (two) times daily.  2 07/21/2024 Morning   chlorthalidone (HYGROTON) 50 MG tablet Take 50 mg by mouth daily.    07/21/2024 Morning   Cholecalciferol (VITAMIN D3) 25 MCG (1000 UT) CAPS Take 1 capsule by mouth daily.    Past Week   colchicine  0.6 MG tablet Take 2 tablets then an hour later take another tablet. The next day take one tablet daily until complete. 9 tablet 0 07/20/2024   cyanocobalamin (,VITAMIN B-12,) 1000 MCG/ML injection INJECT 1 ML ONCE EVERY MONTH   Past Week   FEROSUL 325 (65 Fe) MG tablet Take 325 mg by mouth daily.   Past Week   metFORMIN (GLUCOPHAGE) 500 MG tablet Take 500 mg by mouth 2 (two) times daily.  2 07/20/2024   rosuvastatin (CRESTOR) 40 MG tablet Take by mouth.   07/20/2024   tirzepatide (MOUNJARO) 10 MG/0.5ML Pen Inject 10 mg into the skin once a week.   07/07/2024   docusate sodium (COLACE) 100 MG capsule Take 100 mg by mouth daily.      EDARBYCLOR 40-25 MG TABS Take 1 tablet by mouth daily.      ibuprofen  (ADVIL ) 800 MG tablet Take 1 tablet (800 mg total) by mouth 3 (three) times daily. 21 tablet 0     levocetirizine (XYZAL) 5 MG tablet Take 5 mg by mouth daily.  5    potassium chloride SA (KLOR-CON) 20 MEQ tablet Take 20 mEq by mouth daily.       pravastatin (PRAVACHOL) 20 MG tablet Take 20 mg by mouth every evening. (Patient not taking: Reported on 07/21/2024)  0 Not Taking     Allergies  Allergen Reactions   Gluten Meal    Other     Glue     Past Medical History:  Diagnosis Date   B12 deficiency    Diabetes mellitus without complication (HCC)    Gout    Hypertension    Primary osteoarthritis of left knee    Primary osteoarthritis of right knee    Thrombocytopenia (HCC)     Review of systems:  Otherwise negative.    Physical Exam  Gen: Alert, oriented. Appears stated age.  HEENT: PERRLA. Lungs: No respiratory distress CV: RRR Abd: soft, benign, no masses Ext: No edema    Planned procedures: Proceed with EGD/colonoscopy. The patient understands the nature of the planned procedure, indications, risks, alternatives and potential complications including but not limited to bleeding, infection, perforation, damage to internal organs and possible oversedation/side effects from anesthesia. The patient agrees and gives consent to proceed.  Please refer to procedure notes for findings, recommendations and patient disposition/instructions.  Taylor ONEIDA Schick, MD [facility]

## 2024-07-24 LAB — SURGICAL PATHOLOGY
